# Patient Record
Sex: Female | Born: 1990 | Hispanic: No | Marital: Single | Smoking: Never smoker
Health system: Southern US, Community
[De-identification: ages and names within clinical notes are randomized; demographics above are authoritative.]

## PROBLEM LIST (undated history)

## (undated) DIAGNOSIS — F329 Major depressive disorder, single episode, unspecified: Secondary | ICD-10-CM

## (undated) DIAGNOSIS — F32A Depression, unspecified: Secondary | ICD-10-CM

---
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
## 2015-04-02 ENCOUNTER — Encounter (HOSPITAL_COMMUNITY): Payer: Self-pay | Admitting: *Deleted

## 2015-04-02 ENCOUNTER — Emergency Department (HOSPITAL_COMMUNITY)
Admission: EM | Admit: 2015-04-02 | Discharge: 2015-04-03 | Disposition: A | Discharge status: Home / Self Care | Payer: Self-pay | Attending: Emergency Medicine | Admitting: Emergency Medicine

## 2015-04-02 DIAGNOSIS — F32A Depression, unspecified: Secondary | ICD-10-CM

## 2015-04-02 DIAGNOSIS — Z3202 Encounter for pregnancy test, result negative: Secondary | ICD-10-CM | POA: Insufficient documentation

## 2015-04-02 DIAGNOSIS — F329 Major depressive disorder, single episode, unspecified: Secondary | ICD-10-CM | POA: Insufficient documentation

## 2015-04-02 DIAGNOSIS — G44209 Tension-type headache, unspecified, not intractable: Secondary | ICD-10-CM | POA: Insufficient documentation

## 2015-04-02 DIAGNOSIS — F39 Unspecified mood [affective] disorder: Secondary | ICD-10-CM | POA: Diagnosis present

## 2015-04-02 HISTORY — DX: Major depressive disorder, single episode, unspecified: F32.9

## 2015-04-02 HISTORY — DX: Depression, unspecified: F32.A

## 2015-04-02 LAB — COMPREHENSIVE METABOLIC PANEL
ALBUMIN: 4.9 g/dL (ref 3.5–5.0)
ALT: 13 U/L — ABNORMAL LOW (ref 14–54)
ANION GAP: 10 (ref 5–15)
AST: 17 U/L (ref 15–41)
Alkaline Phosphatase: 35 U/L — ABNORMAL LOW (ref 38–126)
BUN: 16 mg/dL (ref 6–20)
CALCIUM: 10.3 mg/dL (ref 8.9–10.3)
CO2: 24 mmol/L (ref 22–32)
CREATININE: 0.59 mg/dL (ref 0.44–1.00)
Chloride: 104 mmol/L (ref 101–111)
GFR calc non Af Amer: >60 mL/min (ref >60)
Glucose, Bld: 102 mg/dL — ABNORMAL HIGH (ref 65–99)
POTASSIUM: 3.6 mmol/L (ref 3.5–5.1)
SODIUM: 138 mmol/L (ref 135–145)
Total Bilirubin: 0.5 mg/dL (ref 0.3–1.2)
Total Protein: 9.2 g/dL — ABNORMAL HIGH (ref 6.5–8.1)

## 2015-04-02 LAB — RAPID URINE DRUG SCREEN, HOSP PERFORMED
Amphetamines: NOT DETECTED
Barbiturates: NOT DETECTED
Benzodiazepines: NOT DETECTED
Cocaine: NOT DETECTED
OPIATES: NOT DETECTED
TETRAHYDROCANNABINOL: NOT DETECTED

## 2015-04-02 LAB — SALICYLATE LEVEL

## 2015-04-02 LAB — CBC
HCT: 38.2 % (ref 36.0–46.0)
HEMOGLOBIN: 12.8 g/dL (ref 12.0–15.0)
MCH: 26.4 pg (ref 26.0–34.0)
MCHC: 33.5 g/dL (ref 30.0–36.0)
MCV: 78.8 fL (ref 78.0–100.0)
PLATELETS: 238 10*3/uL (ref 150–400)
RBC: 4.85 MIL/uL (ref 3.87–5.11)
RDW: 13.7 % (ref 11.5–15.5)
WBC: 5.4 10*3/uL (ref 4.0–10.5)

## 2015-04-02 LAB — ACETAMINOPHEN LEVEL: Acetaminophen (Tylenol), Serum: <10 ug/mL — ABNORMAL LOW (ref 10–30)

## 2015-04-02 LAB — I-STAT BETA HCG BLOOD, ED (MC, WL, AP ONLY): I-stat hCG, quantitative: <5 m[IU]/mL (ref <5)

## 2015-04-02 LAB — ETHANOL

## 2015-04-02 MED ORDER — IBUPROFEN 200 MG PO TABS: 600.0000 mg | ORAL_TABLET | Freq: Three times a day (TID) | ORAL | Status: DC | PRN

## 2015-04-02 MED ORDER — ZOLPIDEM TARTRATE 5 MG PO TABS: 5.0000 mg | ORAL_TABLET | Freq: Every evening | ORAL | Status: DC | PRN

## 2015-04-02 MED ORDER — LORAZEPAM 1 MG PO TABS: 1.0000 mg | ORAL_TABLET | Freq: Three times a day (TID) | ORAL | Status: DC | PRN

## 2015-04-02 MED ORDER — ALUM & MAG HYDROXIDE-SIMETH 200-200-20 MG/5ML PO SUSP: 30.0000 mL | ORAL | Status: DC | PRN

## 2015-04-02 MED ORDER — ONDANSETRON HCL 4 MG PO TABS: 4.0000 mg | ORAL_TABLET | Freq: Three times a day (TID) | ORAL | Status: DC | PRN

## 2015-04-02 MED ORDER — ACETAMINOPHEN 325 MG PO TABS: 650.0000 mg | ORAL_TABLET | ORAL | Status: DC | PRN

## 2015-04-02 NOTE — ED Notes (Signed)
Pt states that she began having a headache and generalized malaise since Fri; pt c/o nausea with no vomiting; pt states that she took Tylenol on Fri and it helped some but has taken anything since; pt denies photophobia or visual changes; pt states "I think it is stress"

## 2015-04-02 NOTE — ED Notes (Signed)
Pt belongings: beige pocket book, iphone, blue shoes, red and black pants, green shirt, jacket, necklace, bra

## 2015-04-02 NOTE — ED Notes (Signed)
[facility] ambulatory with a steady gait. Patient oriented to unit. Patient denies SI, HI and AVH at this time. Plan of care discussed with patient. Patient voices no complaints or concerns at this time. Encouragement and support provided and safety maintain.

## 2015-04-02 NOTE — ED Notes (Signed)
Pt states that she is feeling suicidal while doing triage; pt states that she feels this way when she gets stressed out; pt states that work and school and home life are too much for here to deal with; pt denies having a specific plan; pt states that she did have a plan but she doesn't anymore

## 2015-04-02 NOTE — ED Provider Notes (Signed)
CSN: [id number]     Arrival date & time 04/02/15  1944 History   First MD Initiated Contact with Patient 04/02/15 2231     Chief Complaint  Patient presents with  . Suicidal  . Headache     (Consider location/radiation/quality/duration/timing/severity/associated sxs/prior Treatment) Patient is a 24 y.o. female presenting with mental health disorder. The history is provided by the patient. No language interpreter was used.  Mental Health Problem Presenting symptoms: depression and suicidal thoughts   Degree of incapacity (severity):  Moderate Onset quality:  Gradual Timing:  Intermittent Progression:  Worsening Chronicity:  New Context: not alcohol use and not drug abuse   Treatment compliance:  Untreated Relieved by:  Nothing Worsened by:  Nothing tried Ineffective treatments:  None tried Associated symptoms: headaches   Risk factors: no family hx of mental illness   Pt complains of a headache today.  Pt reports she has depression.  No treatment.  Pt reports she can not talk about her emotions. Pt reports she does not have a plan to harm herself.  Pt reports if it happens it happens.   History reviewed. No pertinent past medical history. History reviewed. No pertinent past surgical history. No family history on file. Social History  Substance Use Topics  . Smoking status: Never Smoker   . Smokeless tobacco: None  . Alcohol Use: Yes     Comment: socially   OB History    No data available     Review of Systems  Neurological: Positive for headaches.  Psychiatric/Behavioral: Positive for suicidal ideas.  All other systems reviewed and are negative.     Allergies  Review of patient's allergies indicates no known allergies.  Home Medications   Prior to Admission medications   Medication Sig Start Date End Date Taking? Authorizing Provider  ibuprofen (ADVIL,MOTRIN) 200 MG tablet Take 400 mg by mouth every 6 (six) hours as needed for moderate pain.   Yes Historical  Provider, MD   BP 121/66 mmHg  Pulse 75  Temp(Src) 99.3 F (37.4 C) (Oral)  Resp 20  Ht 5\' 9"  (1.753 m)  Wt 52.617 kg  BMI 17.12 kg/m2  SpO2 100%  LMP 03/20/2015 Physical Exam  Constitutional: She is oriented to person, place, and time. She appears well-developed and well-nourished.  HENT:  Head: Normocephalic and atraumatic.  Right Ear: External ear normal.  Left Ear: External ear normal.  Mouth/Throat: Oropharynx is clear and moist.  Eyes: EOM are normal. Pupils are equal, round, and reactive to light.  Neck: Normal range of motion.  Cardiovascular: Normal rate and normal heart sounds.   Pulmonary/Chest: Effort normal.  Abdominal: She exhibits no distension.  Musculoskeletal: Normal range of motion.  Neurological: She is alert and oriented to person, place, and time.  Psychiatric:  Flat depressed appearring  Nursing note and vitals reviewed.   ED Course  Procedures (including critical care time) Labs Review Labs Reviewed  COMPREHENSIVE METABOLIC PANEL - Abnormal; Notable for the following:    Glucose, Bld 102 (*)    Total Protein 9.2 (*)    ALT 13 (*)    Alkaline Phosphatase 35 (*)    All other components within normal limits  ACETAMINOPHEN LEVEL - Abnormal; Notable for the following:    Acetaminophen (Tylenol), Serum <10 (*)    All other components within normal limits  ETHANOL  SALICYLATE LEVEL  CBC  URINE RAPID DRUG SCREEN, HOSP PERFORMED  I-STAT BETA HCG BLOOD, ED ([facility], [facility], [facility] ONLY)    Imaging Review  No results found. I have personally reviewed and evaluated these images and lab results as part of my medical decision-making.   EKG Interpretation None      MDM   Final diagnoses:  Depression  Tension-type headache, not intractable, unspecified chronicity pattern       Elson Areas, PA-C 04/02/15 2253  Tilden Fossa, MD 04/03/15 [phone]

## 2015-04-03 ENCOUNTER — Encounter (HOSPITAL_COMMUNITY): Payer: Self-pay | Admitting: *Deleted

## 2015-04-03 ENCOUNTER — Observation Stay (HOSPITAL_COMMUNITY)
Admission: AD | Admit: 2015-04-03 | Payer: Federal, State, Local not specified - Other | Source: Intra-hospital | Admitting: Psychiatry

## 2015-04-03 DIAGNOSIS — F329 Major depressive disorder, single episode, unspecified: Secondary | ICD-10-CM | POA: Insufficient documentation

## 2015-04-03 DIAGNOSIS — F32A Depression, unspecified: Secondary | ICD-10-CM | POA: Insufficient documentation

## 2015-04-03 DIAGNOSIS — F39 Unspecified mood [affective] disorder: Secondary | ICD-10-CM | POA: Insufficient documentation

## 2015-04-03 NOTE — BH Assessment (Signed)
Tele Assessment Note   Heather Mccullough is a 24 y.o. female who voluntarily presents to [facility] c/o headache and discomfort since Friday and told medical staff her illness may be related to stress.  While in the [employer], pt stated that she was SI.  Pt is no longer endorsing SI thoughts and she has no plan or intent to harm herself.  She admits that prior to her arrival in the [place], while still living in [place], she ingested oil that is used for lighting. This happened approx  7 yrs ago and she has not attempted to harm herself since that time.  She says was having thoughts prior to arrival and says she is only happy when she is around people and when she is alone she about not being here. Pt denies HI/SA/AVH.  She says she is stressed about work, "life" and school, stating she is sad because she was supposed to graduate, however it will not be this year, she is a Consulting civil engineer at [employer]  Pt contracted for safety with this Clinical research associate and requested to return home.  This Clinical research associate spoke with Donell Sievert, PA who recommends AM psych eval for final disposition.  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 Depression     History reviewed. No pertinent past surgical history.  Family History: No family history on file.  Social History:  reports that she has never smoked. She does not have any smokeless tobacco history on file. She reports that she drinks alcohol. She reports that she does not use illicit drugs.  Additional Social History:  Alcohol / Drug Use Pain Medications: See MAR  Prescriptions: See MAR  Over the Counter: See MAR  History of alcohol / drug use?: No history of alcohol / drug abuse Longest period of sobriety (when/how long): None   CIWA: CIWA-Ar BP: 111/65 mmHg Pulse Rate: 66 COWS:    PATIENT STRENGTHS: (choose at least two) Communication skills Supportive family/friends  Allergies: No  Known Allergies  Home Medications:  (Not in a hospital admission)  OB/GYN Status:  Patient's last menstrual period was 03/20/2015.  General Assessment Data [facility] of Assessment: [facility] TTS Assessment: In system Is this a Tele or Face-to-Face Assessment?: Tele Assessment Is this an Initial Assessment or a Re-assessment for this encounter?: Initial Assessment Marital status: Single Maiden name: None  Is patient pregnant?: No Pregnancy Status: No Living Arrangements: Parent, Other relatives (Lives with mother and brother ) Can pt return to current living arrangement?: Yes Admission Status: Voluntary Is patient capable of signing voluntary admission?: Yes Referral Source: MD Insurance type: SP  Medical Screening Exam [facility] Walk-in ONLY) Medical Exam completed: No Reason for MSE not completed: Other: (None )  Crisis Care Plan Living Arrangements: Parent, Other relatives (Lives with mother and brother ) Name of Psychiatrist: None  Name of Therapist: None   Education Status Is patient currently in school?: Yes Current Grade: Unk  Highest grade of school patient has completed: Unk  Name of school: [employer] Contact person: None   Risk to self with the past 6 months Suicidal Ideation: No-Not Currently/Within Last 6 Months Has patient been a risk to self within the past 6 months prior to admission? : No Suicidal Intent: No-Not Currently/Within Last 6 Months Has patient had any suicidal intent within the past 6 months prior to admission? : No Is patient at risk for suicide?: No Suicidal Plan?: No-Not  Currently/Within Last 6 Months Has patient had any suicidal plan within the past 6 months prior to admission? : Yes Access to Means: No What has been your use of drugs/alcohol within the last 12 months?: Pt denies  Previous Attempts/Gestures: Yes How many times?: 1 Other Self Harm Risks: None  Triggers for Past Attempts: Unpredictable Intentional Self Injurious Behavior: None Family  Suicide History: No Recent stressful life event(s): Other (Comment) (School, work-related and "life") Persecutory voices/beliefs?: No Depression: Yes Depression Symptoms: Loss of interest in usual pleasures Substance abuse history and/or treatment for substance abuse?: No Suicide prevention information given to non-admitted patients: Not applicable  Risk to Others within the past 6 months Homicidal Ideation: No Does patient have any lifetime risk of violence toward others beyond the six months prior to admission? : No Thoughts of Harm to Others: No Current Homicidal Intent: No Current Homicidal Plan: No Access to Homicidal Means: No Identified Victim: None  History of harm to others?: No Assessment of Violence: None Noted Violent Behavior Description: None  Does patient have access to weapons?: No Criminal Charges Pending?: No Does patient have a court date: No Is patient on probation?: No  Psychosis Hallucinations: None noted Delusions: None noted  Mental Status Report Appearance/Hygiene: In scrubs Eye Contact: Fair Motor Activity: Unremarkable Speech: Logical/coherent Level of Consciousness: Alert, Quiet/awake Mood: Depressed Affect: Sad Anxiety Level: None Thought Processes: Coherent, Relevant Judgement: Unimpaired Orientation: Person, Place, Time, Situation Obsessive Compulsive Thoughts/Behaviors: None  Cognitive Functioning Concentration: Normal Memory: Recent Intact, Remote Intact IQ: Average Insight: Fair Impulse Control: Fair Appetite: Fair Weight Loss: 0 Weight Gain: 0 Sleep: No Change Total Hours of Sleep: 6 Vegetative Symptoms: None  ADLScreening [facility] Assessment Services) Patient's cognitive ability adequate to safely complete daily activities?: Yes Patient able to express need for assistance with ADLs?: Yes Independently performs ADLs?: Yes (appropriate for developmental age)  Prior Inpatient Therapy Prior Inpatient Therapy: No Prior Therapy  Dates: None  Prior Therapy Facilty/Provider(s): None  Reason for Treatment: None   Prior Outpatient Therapy Prior Outpatient Therapy: No Prior Therapy Dates: None  Prior Therapy Facilty/Provider(s): None  Reason for Treatment: None  Does patient have an ACCT team?: No Does patient have Intensive In-House Services?  : No Does patient have [facility] services? : No Does patient have [facility] services?: No  ADL Screening (condition at time of admission) Patient's cognitive ability adequate to safely complete daily activities?: Yes Is the patient deaf or have difficulty hearing?: No Does the patient have difficulty seeing, even when wearing glasses/contacts?: No Does the patient have difficulty concentrating, remembering, or making decisions?: No Patient able to express need for assistance with ADLs?: Yes Does the patient have difficulty dressing or bathing?: No Independently performs ADLs?: Yes (appropriate for developmental age) Does the patient have difficulty walking or climbing stairs?: No Weakness of Legs: None Weakness of Arms/Hands: None  Home Assistive Devices/Equipment Home Assistive Devices/Equipment: None  Therapy Consults (therapy consults require a physician order) PT Evaluation Needed: No OT Evalulation Needed: No SLP Evaluation Needed: No Abuse/Neglect Assessment (Assessment to be complete while patient is alone) Physical Abuse: Denies Verbal Abuse: Denies Sexual Abuse: Denies Exploitation of patient/patient's resources: Denies Self-Neglect: Denies Values / Beliefs Cultural Requests During Hospitalization: None Spiritual Requests During Hospitalization: None Consults Spiritual Care Consult Needed: No Social Work Consult Needed: No Merchant navy officerAdvance Directives (For Healthcare) Does patient have an advance directive?: No Would patient like information on creating an advanced directive?: No - patient declined information    Additional Information 1:1  In Past 12 Months?:  No CIRT Risk: No Elopement Risk: No Does patient have medical clearance?: Yes     Disposition:  Disposition Initial Assessment Completed for this Encounter: Yes Disposition of Patient: Referred to (Per Donell Sievert, PA, AM psych eval for final disposition ) Patient referred to: Other (Comment) (Per Donell Sievert, PA, AM psych eval for final disposition )  Murrell Redden 04/03/2015 12:24 AM

## 2015-04-03 NOTE — ED Notes (Signed)
Discharge instructions given and reviewed.  All belongings were returned to pt.  Pt denied SI and HI.  Mental health resources given.

## 2015-04-03 NOTE — Progress Notes (Signed)
CM spoke with [place] who [place].  CM discussed and provided written information for uninsured accepting pcps, discussed the importance of pcp vs EDP services for f/u care, www.needymeds.org, www.goodrx.com, discounted pharmacies and other [place] resources such as [facility] , [facility], affordable care act, financial assistance, uninsured dental services, [place] med assist, DSS and  health department  Reviewed resources for [place] uninsured accepting pcps like [facility], [facility] [address], [facility], [facility], local urgent care centers, [facility], [facility], general medical clinics, [facility] services of the [place], [facility] urgent [facility] plus others, medication resources, [facility] out patient pharmacies and housing Pt voiced understanding and appreciation of resources provided   Provided [facility] contact information Pt agreed to a referral Cm completed referral Pt [facility] clinical [facility]ason Cm also emphasized to pt use of goodrx for discounted medications and [facility] for uninsured [facility] prior to d/c  All resources provided in [facility] translation for understanding

## 2015-04-03 NOTE — Consult Note (Signed)
[place] [facility] Consult   Reason for Consult:  Anger, agitation Referring Physician:  EDU Patient Identification: Heather Mccullough MRN:  [id number] Principal Diagnosis: Unspecified mood (affective) disorder ([place]) Diagnosis:   Patient Active Problem List   Diagnosis Date Noted  . Unspecified mood (affective) disorder ([place]) [F39] 04/03/2015    Priority: High    Total Time spent with patient: 45 minutes  Subjective:   Heather Mccullough is a 24 y.o. female patient admitted with Anger, agitaion.  HPI:  AA female, 24 years old was evaluated for suicide feeling.  Patient originally came to the ER for headache and fatigue but told staff in triage she was feeling suicidal.  Today, she denies feeling suicidal and stated that she does have thoughts of committing suicide once in a while.  She reported that the last time she had suicide thought was last week.  She states she think about suicide when she has a lot in her mind but could not elaborate further.    Patient reports that she is going through a lot of stress due to dropping out of school, work stress and family.  She lives with her mother and 3 brothers and states that her relationship with her mother is not the best.  Patient reports that she dropped out of school at [employer].  She reports poor sleep and appetite and denies any Alcohol or drug use.  Patient denies SI/HI/AVH. Collateral Information from Mother:  Patient's mother reported that patient is having difficulty with school and work  Her mother reported that she left [employer] this year and went up [place] for collage and came back after a semester.  Mother reports that in the house she is constantly angry and have no good relationship with her and her three brothers.  Mother denies any mental illness in the family, states that patient does not and have never been seen by a Management consultant.   Patient is discharged home to mother but  will follow up with [facility] for therapy sessions.  Past Psychiatric History:  Denies  Risk to Self: Suicidal Ideation: No-Not Currently/Within Last 6 Months Suicidal Intent: No-Not Currently/Within Last 6 Months Is patient at risk for suicide?: No Suicidal Plan?: No-Not Currently/Within Last 6 Months Access to Means: No What has been your use of drugs/alcohol within the last 12 months?: Pt denies  How many times?: 1 Other Self Harm Risks: None  Triggers for Past Attempts: Unpredictable Intentional Self Injurious Behavior: None Risk to Others: Homicidal Ideation: No Thoughts of Harm to Others: No Current Homicidal Intent: No Current Homicidal Plan: No Access to Homicidal Means: No Identified Victim: None  History of harm to others?: No Assessment of Violence: None Noted Violent Behavior Description: None  Does patient have access to weapons?: No Criminal Charges Pending?: No Does patient have a court date: No Prior Inpatient Therapy: Prior Inpatient Therapy: No Prior Therapy Dates: None  Prior Therapy Facilty/Provider(s): None  Reason for Treatment: None  Prior Outpatient Therapy: Prior Outpatient Therapy: No Prior Therapy Dates: None  Prior Therapy Facilty/Provider(s): None  Reason for Treatment: None  Does patient have an ACCT team?: No Does patient have Intensive In-House Services?  : No Does patient have [facility] services? : No Does patient have P4CC services?: No  Past Medical History:  Past Medical History  Diagnosis Date  . Depression    History reviewed. No pertinent past surgical history. Family History: No family history on file.   Family Psychiatric  History:  Denies by both mom and patient Social History:  History  Alcohol Use  . Yes    Comment: socially     History  Drug Use No    Social History   Social History  . Marital Status: Single    Spouse Name: N/A  . Number of Children: N/A  . Years of Education: N/A   Social History Main Topics  .  Smoking status: Never Smoker   . Smokeless tobacco: None  . Alcohol Use: Yes     Comment: socially  . Drug Use: No  . Sexual Activity: Not Asked   Other Topics Concern  . None   Social History Narrative   Additional Social History:    Pain Medications: See MAR  Prescriptions: See MAR  Over the Counter: See MAR  History of alcohol / drug use?: No history of alcohol / drug abuse Longest period of sobriety (when/how long): None    Allergies:  No Known Allergies  Labs:  Results for orders placed or performed during the hospital encounter of 04/02/15 (from the past 48 hour(s))  Comprehensive metabolic panel     Status: Abnormal   Collection Time: 04/02/15  8:39 PM  Result Value Ref Range   Sodium 138 135 - 145 mmol/L   Potassium 3.6 3.5 - 5.1 mmol/L   Chloride 104 101 - 111 mmol/L   CO2 24 22 - 32 mmol/L   Glucose, Bld 102 (H) 65 - 99 mg/dL   BUN 16 6 - 20 mg/dL   Creatinine, Ser 0.59 0.44 - 1.00 mg/dL   Calcium 10.3 8.9 - 10.3 mg/dL   Total Protein 9.2 (H) 6.5 - 8.1 g/dL   Albumin 4.9 3.5 - 5.0 g/dL   AST 17 15 - 41 U/L   ALT 13 (L) 14 - 54 U/L   Alkaline Phosphatase 35 (L) 38 - 126 U/L   Total Bilirubin 0.5 0.3 - 1.2 mg/dL   GFR calc non Af Amer >60 >60 mL/min   GFR calc Af Amer >60 >60 mL/min    Comment: (NOTE) The eGFR has been calculated using the CKD EPI equation. This calculation has not been validated in all clinical situations. eGFR's persistently <60 mL/min signify possible Chronic Kidney Disease.    Anion gap 10 5 - 15  Ethanol (ETOH)     Status: None   Collection Time: 04/02/15  8:39 PM  Result Value Ref Range   Alcohol, Ethyl (B) <5 <5 mg/dL    Comment:        LOWEST DETECTABLE LIMIT FOR SERUM ALCOHOL IS 5 mg/dL FOR MEDICAL PURPOSES ONLY   Salicylate level     Status: None   Collection Time: 04/02/15  8:39 PM  Result Value Ref Range   Salicylate Lvl <8.2 2.8 - 30.0 mg/dL  Acetaminophen level     Status: Abnormal   Collection Time: 04/02/15   8:39 PM  Result Value Ref Range   Acetaminophen (Tylenol), Serum <10 (L) 10 - 30 ug/mL    Comment:        THERAPEUTIC CONCENTRATIONS VARY SIGNIFICANTLY. A RANGE OF 10-30 ug/mL MAY BE AN EFFECTIVE CONCENTRATION FOR MANY PATIENTS. HOWEVER, SOME ARE BEST TREATED AT CONCENTRATIONS OUTSIDE THIS RANGE. ACETAMINOPHEN CONCENTRATIONS >150 ug/mL AT 4 HOURS AFTER INGESTION AND >50 ug/mL AT 12 HOURS AFTER INGESTION ARE OFTEN ASSOCIATED WITH TOXIC REACTIONS.   CBC     Status: None   Collection Time: 04/02/15  8:39 PM  Result Value Ref Range  WBC 5.4 4.0 - 10.5 K/uL   RBC 4.85 3.87 - 5.11 MIL/uL   Hemoglobin 12.8 12.0 - 15.0 g/dL   HCT 38.2 36.0 - 46.0 %   MCV 78.8 78.0 - 100.0 fL   MCH 26.4 26.0 - 34.0 pg   MCHC 33.5 30.0 - 36.0 g/dL   RDW 13.7 11.5 - 15.5 %   Platelets 238 150 - 400 K/uL  I-Stat beta hCG blood, ED ([facility], [facility], [facility] only)     Status: None   Collection Time: 04/02/15  8:45 PM  Result Value Ref Range   I-stat hCG, quantitative <5.0 <5 mIU/mL   Comment 3            Comment:   GEST. AGE      CONC.  (mIU/mL)   <=1 WEEK        5 - 50     2 WEEKS       50 - 500     3 WEEKS       100 - 10,000     4 WEEKS     1,000 - 30,000        FEMALE AND NON-PREGNANT FEMALE:     LESS THAN 5 mIU/mL   Urine rapid drug screen (hosp performed) (Not at [facility])     Status: None   Collection Time: 04/02/15 10:59 PM  Result Value Ref Range   Opiates NONE DETECTED NONE DETECTED   Cocaine NONE DETECTED NONE DETECTED   Benzodiazepines NONE DETECTED NONE DETECTED   Amphetamines NONE DETECTED NONE DETECTED   Tetrahydrocannabinol NONE DETECTED NONE DETECTED   Barbiturates NONE DETECTED NONE DETECTED    Comment:        DRUG SCREEN FOR MEDICAL PURPOSES ONLY.  IF CONFIRMATION IS NEEDED FOR ANY PURPOSE, NOTIFY LAB WITHIN 5 DAYS.        LOWEST DETECTABLE LIMITS FOR URINE DRUG SCREEN Drug Class       Cutoff (ng/mL) Amphetamine      1000 Barbiturate      200 Benzodiazepine   102 Tricyclics        725 Opiates          300 Cocaine          300 THC              50     Current Facility-Administered Medications  Medication Dose Route Frequency Provider Last Rate Last Dose  . acetaminophen (TYLENOL) tablet 650 mg  650 mg Oral Q4H PRN Fransico Meadow, PA-C      . alum & mag hydroxide-simeth (MAALOX/MYLANTA) 200-200-20 MG/5ML suspension 30 mL  30 mL Oral PRN Fransico Meadow, PA-C      . ibuprofen (ADVIL,MOTRIN) tablet 600 mg  600 mg Oral Q8H PRN Fransico Meadow, PA-C      . LORazepam (ATIVAN) tablet 1 mg  1 mg Oral Q8H PRN Fransico Meadow, PA-C      . ondansetron [facility]) tablet 4 mg  4 mg Oral Q8H PRN Fransico Meadow, PA-C      . zolpidem (AMBIEN) tablet 5 mg  5 mg Oral QHS PRN Fransico Meadow, PA-C       Current Outpatient Prescriptions  Medication Sig Dispense Refill  . ibuprofen (ADVIL,MOTRIN) 200 MG tablet Take 400 mg by mouth every 6 (six) hours as needed for moderate pain.      Musculoskeletal: Strength & Muscle Tone: within normal limits Gait & Station: normal Patient leans: N/A  Psychiatric  Specialty Exam: Review of Systems  Constitutional: Negative.   HENT: Negative.   Eyes: Negative.   Respiratory: Negative.   Cardiovascular: Negative.   Gastrointestinal: Negative.   Genitourinary: Negative.   Skin: Negative.   Neurological: Negative.   Endo/Heme/Allergies: Negative.     Blood pressure 96/60, pulse 71, temperature 98 F (36.7 C), temperature source Oral, resp. rate 14, height 5' 9"  (1.753 m), weight 52.617 kg (116 lb), last menstrual period 03/20/2015, SpO2 100 %.Body mass index is 17.12 kg/(m^2).  General Appearance: Casual and Fairly Groomed  Eye Contact::  Minimal  Speech:  Clear and Coherent and Normal Rate  Volume:  Normal  Mood:  Angry  Affect:  Congruent  Thought Process:  Coherent, Goal Directed and Intact  Orientation:  Full (Time, Place, and Person)  Thought Content:  WDL  Suicidal Thoughts:  No  Homicidal Thoughts:  No  Memory:  Immediate;    Good Recent;   Good Remote;   Good  Judgement:  Fair  Insight:  Fair  Psychomotor Activity:  Normal  Concentration:  Fair  Recall:  NA  Fund of Knowledge:Fair  Language: Good  Akathisia:  NA  Handed:  Right  AIMS (if indicated):     Assets:  Desire for Improvement  ADL's:  Intact  Cognition: WNL  Sleep:      Disposition: Discharge home, follow up with Gearldine Bienenstock   PMHNP-BC 04/03/2015 11:10 AM Patient seen face-to-face for psychiatric evaluation, chart reviewed and case discussed with the physician extender and developed treatment plan. Reviewed the information documented and agree with the treatment plan. Corena Pilgrim, MD

## 2015-04-03 NOTE — BH Assessment (Signed)
04/03/2015  [facility] by Dr. Jannifer FranklinAkintayo and Julieanne CottonJosephine, NP. The attending provider for the obs unit is Patria ManeMay Agustin, NP and Dr. Lucianne MussKumar. The chair assignment is #6. Nursing report [phone]. [facility].   Writer attempted to get patient to sign support paperwork but she refused. Patient sts, "I don't think anything is wrong with me I would rather go home". Writer updated the psych providers, Dr. Jannifer FranklinAkintayo and Jospehine, NP. The providers agree to discharge patient with [facility].

## 2017-01-29 ENCOUNTER — Emergency Department (HOSPITAL_COMMUNITY)
Admission: EM | Admit: 2017-01-29 | Discharge: 2017-01-29 | Disposition: A | Discharge status: Home / Self Care | Payer: Self-pay | Attending: Emergency Medicine | Admitting: Emergency Medicine

## 2017-01-29 ENCOUNTER — Encounter (HOSPITAL_COMMUNITY): Payer: Self-pay | Admitting: Emergency Medicine

## 2017-01-29 DIAGNOSIS — Z203 Contact with and (suspected) exposure to rabies: Secondary | ICD-10-CM | POA: Insufficient documentation

## 2017-01-29 DIAGNOSIS — Y929 Unspecified place or not applicable: Secondary | ICD-10-CM | POA: Insufficient documentation

## 2017-01-29 DIAGNOSIS — S71152A Open bite, left thigh, initial encounter: Secondary | ICD-10-CM | POA: Insufficient documentation

## 2017-01-29 DIAGNOSIS — Z23 Encounter for immunization: Secondary | ICD-10-CM | POA: Insufficient documentation

## 2017-01-29 DIAGNOSIS — Y998 Other external cause status: Secondary | ICD-10-CM | POA: Insufficient documentation

## 2017-01-29 DIAGNOSIS — W540XXA Bitten by dog, initial encounter: Secondary | ICD-10-CM | POA: Insufficient documentation

## 2017-01-29 DIAGNOSIS — Y939 Activity, unspecified: Secondary | ICD-10-CM | POA: Insufficient documentation

## 2017-01-29 LAB — I-STAT BETA HCG BLOOD, ED (MC, WL, AP ONLY)

## 2017-01-29 MED ORDER — RABIES VACCINE, PCEC IM SUSR
1.0000 mL | Freq: Once | INTRAMUSCULAR | Status: AC
  Administered 2017-01-29: 1 mL via INTRAMUSCULAR
  Filled 2017-01-29: qty 1

## 2017-01-29 MED ORDER — RABIES IMMUNE GLOBULIN 150 UNIT/ML IM INJ
1200.0000 [IU] | INJECTION | Freq: Once | INTRAMUSCULAR | Status: AC
  Administered 2017-01-29: 1200 [IU] via INTRAMUSCULAR
  Filled 2017-01-29: qty 8

## 2017-01-29 MED ORDER — AMOXICILLIN-POT CLAVULANATE 875-125 MG PO TABS: 1.0000 | ORAL_TABLET | Freq: Two times a day (BID) | ORAL | 0 refills | Status: DC

## 2017-01-29 MED ORDER — AMOXICILLIN-POT CLAVULANATE 875-125 MG PO TABS
1.0000 | ORAL_TABLET | Freq: Once | ORAL | Status: AC
  Administered 2017-01-29: 1 via ORAL
  Filled 2017-01-29: qty 1

## 2017-01-29 MED ORDER — TETANUS-DIPHTH-ACELL PERTUSSIS 5-2.5-18.5 LF-MCG/0.5 IM SUSP
0.5000 mL | Freq: Once | INTRAMUSCULAR | Status: AC
  Administered 2017-01-29: 0.5 mL via INTRAMUSCULAR
  Filled 2017-01-29: qty 0.5

## 2017-01-29 NOTE — ED Provider Notes (Signed)
[facility]-EMERGENCY DEPT Provider Note   CSN: [id number] Arrival date & time: 01/29/17  0001     History   Chief Complaint Chief Complaint  Patient presents with  . Animal Bite    HPI Heather Mccullough is a 26 y.o. female Presents to the ED for evaluation of dog bite to left lateral upper thigh onset less than 8 hours PTA. Patient states the dog belonged to a towing company. Unsure of vaccination status or health of the dog. Her tetanus shot is not up to date. Reports mild tenderness locally around bite wound.   HPI  Past Medical History:  Diagnosis Date  . Depression     Patient Active Problem List   Diagnosis Date Noted  . Unspecified mood (affective) disorder ([facility]) 04/03/2015  . Depression   . Mood disorder ([facility])     History reviewed. No pertinent surgical history.  OB History    No data available       Home Medications    Prior to Admission medications   Medication Sig Start Date End Date Taking? Authorizing Provider  amoxicillin-clavulanate (AUGMENTIN) 875-125 MG tablet Take 1 tablet by mouth every 12 (twelve) hours. 01/29/17   Liberty Handy, PA-C  ibuprofen (ADVIL,MOTRIN) 200 MG tablet Take 400 mg by mouth every 6 (six) hours as needed for moderate pain.    [provider]    Family History No family history on file.  Social History Social History  Substance Use Topics  . Smoking status: Never Smoker  . Smokeless tobacco: Not on file  . Alcohol use Yes     Comment: socially     Allergies   Patient has no known allergies.   Review of Systems Review of Systems  Skin: Positive for wound.  Allergic/Immunologic: Negative for immunocompromised state.  All other systems reviewed and are negative.    Physical Exam Updated Vital Signs BP 106/62 (BP Location: Right Arm)   Pulse 76   Temp 98.4 F (36.9 C) (Oral)   Resp 16   Ht  (1.753 m)   Wt 54.4 kg (120 lb)   SpO2 100%   BMI 17.72 kg/m   Physical Exam  Constitutional:  She is oriented to person, place, and time. She appears well-developed and well-nourished. No distress.  NAD.  HENT:  Head: Normocephalic and atraumatic.  Right Ear: External ear normal.  Left Ear: External ear normal.  Nose: Nose normal.  Eyes: Conjunctivae and EOM are normal. No scleral icterus.  Neck: Normal range of motion. Neck supple.  Cardiovascular: Normal rate, regular rhythm and normal heart sounds.   No murmur heard. Pulmonary/Chest: Effort normal and breath sounds normal. She has no wheezes.  Musculoskeletal: Normal range of motion. She exhibits no deformity.  Neurological: She is alert and oriented to person, place, and time.  Skin: Skin is warm and dry. Capillary refill takes less than 2 seconds.  Obvious circular bite mark to left lateral thigh, appropriate local tenderness. No bleeding.   Psychiatric: She has a normal mood and affect. Her behavior is normal. Judgment and thought content normal.  Nursing note and vitals reviewed.    ED Treatments / Results  Labs (all labs ordered are listed, but only abnormal results are displayed) Labs Reviewed  I-STAT BETA HCG BLOOD, ED ([facility], [facility], [facility] ONLY)    EKG  EKG Interpretation None       Radiology No results found.  Procedures Procedures (including critical care time)  Medications Ordered in ED Medications  rabies immune globulin (HYPERAB/KEDRAB) injection 1,200 Units (1,200 Units Intramuscular Given 01/29/17 0630)  rabies vaccine (RABAVERT) injection 1 mL (1 mL Intramuscular Given 01/29/17 0606)  Tdap (BOOSTRIX) injection 0.5 mL (0.5 mLs Intramuscular Given 01/29/17 0526)  amoxicillin-clavulanate (AUGMENTIN) 875-125 MG per tablet 1 tablet (1 tablet Oral Given 01/29/17 0612)     Initial Impression / Assessment and Plan / ED Course  I have reviewed the triage vital signs and the nursing notes.  Pertinent labs & imaging results that were available during my care of the patient were reviewed by me and considered in  my medical decision making (see chart for details).    26 year old female presents to the ED with dog bite to left lateral upper thigh onset less than 8 hours PTA. She does not know the vaccination status or health status of the dog. Her tetanus is not up-to-date. On exam, she has obvious bite mark with appropriate local tenderness. No bleeding. Surrounding compartments are soft. Wound was thoroughly irrigated and cleaned in the ED. Tdap given. Rabies immunoglobulin and vaccine given today, day 0. We'll discharged with wound care instructions, Augmentin and schedule for repeat rabies vaccinations. She is to follow up in urgent care, emergency department or PCP on day 3, 7 and 14 for rabies vaccine. ED return precautions discussed.  Day 0 (01/29/17) - Rabies immunoglobulin and vaccines given Day 3 (02/01/17) - Rabies vaccine needed Day 7 (02/05/17) - Rabies vaccine needed Day 14 (02/12/17) - Rabies vaccine needed  Final Clinical Impressions(s) / ED Diagnoses   Final diagnoses:  Dog bite, initial encounter  Need for rabies vaccination    New Prescriptions New Prescriptions   AMOXICILLIN-CLAVULANATE (AUGMENTIN) 875-125 MG TABLET    Take 1 tablet by mouth every 12 (twelve) hours.     Liberty Handy, PA-C 01/29/17 1610    Zadie Rhine, MD 01/29/17 [phone]

## 2017-01-29 NOTE — ED Triage Notes (Signed)
Pt states her car was towed and when she went to towing company to pick her car up a dog there attacked her. Pt has bite mark to L hand and L thigh.   They are unsure if dog has up to date shots. Her tetanus shot is not up to date.

## 2017-01-29 NOTE — Discharge Instructions (Signed)
You presented to the ED after dog bite. Your wound was thoroughly cleaned. You received a tetanus shot. You also received rabies immunoglobulin and vaccine today.  You need a repeat rabies vaccine on days 3, 7 and 14. Follow up with primary care doctor, urgent care emergency department to receive the rabies vaccines. Here is your rabies schedule: Day 0 (01/29/17) - Rabies immunoglobulin and vaccines done Day 3 (02/01/17) - Rabies vaccine Day 7 (02/05/17) - Rabies vaccine Day 14 (02/12/17) - Rabies vaccine  Keep your wound clean, rinse with water and soap at least twice daily. Monitor for signs of infection including increased pain, swelling, redness, drainage, fevers. Take antibiotic as prescribed.

## 2017-01-29 NOTE — ED Notes (Signed)
Irrigated/cleaned wounds on left hand and left leg with NS and sterile gauze.

## 2017-01-29 NOTE — ED Notes (Addendum)
Attempted blood draw x1 in left AC without success.  Called phlebotomy to draw lab.

## 2017-02-01 ENCOUNTER — Encounter (HOSPITAL_COMMUNITY): Payer: Self-pay | Admitting: Emergency Medicine

## 2017-02-01 ENCOUNTER — Emergency Department (HOSPITAL_COMMUNITY)
Admission: EM | Admit: 2017-02-01 | Discharge: 2017-02-01 | Disposition: A | Discharge status: Home / Self Care | Payer: Self-pay | Attending: Emergency Medicine | Admitting: Emergency Medicine

## 2017-02-01 DIAGNOSIS — Z23 Encounter for immunization: Secondary | ICD-10-CM | POA: Insufficient documentation

## 2017-02-01 DIAGNOSIS — Z203 Contact with and (suspected) exposure to rabies: Secondary | ICD-10-CM | POA: Insufficient documentation

## 2017-02-01 MED ORDER — RABIES VACCINE, PCEC IM SUSR
1.0000 mL | Freq: Once | INTRAMUSCULAR | Status: AC
Start: 2017-02-01 — End: 2017-02-01
  Administered 2017-02-01: 1 mL via INTRAMUSCULAR
  Filled 2017-02-01: qty 1

## 2017-02-01 NOTE — ED Triage Notes (Signed)
Follow up for rabies series

## 2017-02-01 NOTE — ED Provider Notes (Signed)
  [facility]-EMERGENCY DEPT Provider Note   CSN: [id number] Arrival date & time: 02/01/17  1043     History   Chief Complaint Chief Complaint  Patient presents with  . Animal Bite    rabies series    HPI Heather Mccullough is a 26 y.o. female presents to the ED for second rabies vaccine. Patient reports no problems.  HPI  Past Medical History:  Diagnosis Date  . Depression     Patient Active Problem List   Diagnosis Date Noted  . Unspecified mood (affective) disorder ([facility]) 04/03/2015  . Depression   . Mood disorder ([facility])     History reviewed. No pertinent surgical history.  OB History    No data available       Home Medications    Prior to Admission medications   Medication Sig Start Date End Date Taking? Authorizing Provider  amoxicillin-clavulanate (AUGMENTIN) 875-125 MG tablet Take 1 tablet by mouth every 12 (twelve) hours. 01/29/17   Liberty Handy, PA-C  ibuprofen (ADVIL,MOTRIN) 200 MG tablet Take 400 mg by mouth every 6 (six) hours as needed for moderate pain.    [provider]    Family History History reviewed. No pertinent family history.  Social History Social History  Substance Use Topics  . Smoking status: Never Smoker  . Smokeless tobacco: Not on file  . Alcohol use Yes     Comment: socially     Allergies   Patient has no known allergies.   Review of Systems Review of Systems  Musculoskeletal: Positive for arthralgias.  Skin: Positive for wound.  All other systems reviewed and are negative.    Physical Exam Updated Vital Signs BP 106/65 (BP Location: Right Arm)   Pulse 69   Temp 98.7 F (37.1 C) (Oral)   Wt 54.4 kg (120 lb)   LMP 01/25/2017 (Approximate)   SpO2 100%   BMI 17.72 kg/m   Physical Exam  Constitutional: She is oriented to person, place, and time. She appears well-developed and well-nourished. No distress.  HENT:  Head: Normocephalic.  Eyes: EOM are normal.  Neck: Neck supple.  Cardiovascular:  Normal rate.   Pulmonary/Chest: Effort normal.  Musculoskeletal: Normal range of motion.  Neurological: She is alert and oriented to person, place, and time. No cranial nerve deficit.  Skin:  Healing wounds to the lateral aspect of the left thigh. No drainage or signs of infection.   Psychiatric: She has a normal mood and affect.  Nursing note and vitals reviewed.    ED Treatments / Results  Labs (all labs ordered are listed, but only abnormal results are displayed) Labs Reviewed - No data to display   Radiology No results found.  Procedures Procedures (including critical care time)  Medications Ordered in ED Medications  rabies vaccine (RABAVERT) injection 1 mL (not administered)     Initial Impression / Assessment and Plan / ED Course  I have reviewed the triage vital signs and the nursing notes. 26 y.o. female here for second rabies vaccine stable for d/c without signs of wound infection or other problems.   Final Clinical Impressions(s) / ED Diagnoses   Final diagnoses:  Encounter for repeat administration of rabies vaccination    New Prescriptions New Prescriptions   No medications on file     Janne Napoleon, NP 02/01/17 1229    Doug Sou, MD 02/01/17 [phone]

## 2017-02-01 NOTE — Discharge Instructions (Signed)
Follow up as planned for your next vaccine.

## 2017-02-05 ENCOUNTER — Encounter (HOSPITAL_COMMUNITY): Payer: Self-pay | Admitting: *Deleted

## 2017-02-05 ENCOUNTER — Ambulatory Visit (HOSPITAL_COMMUNITY)
Admission: EM | Admit: 2017-02-05 | Discharge: 2017-02-05 | Disposition: A | Discharge status: Home / Self Care | Payer: Self-pay | Attending: Family Medicine | Admitting: Family Medicine

## 2017-02-05 DIAGNOSIS — Z203 Contact with and (suspected) exposure to rabies: Secondary | ICD-10-CM

## 2017-02-05 DIAGNOSIS — Z23 Encounter for immunization: Secondary | ICD-10-CM

## 2017-02-05 MED ORDER — RABIES VACCINE, PCEC IM SUSR
1.0000 mL | Freq: Once | INTRAMUSCULAR | Status: AC
  Administered 2017-02-05: 1 mL via INTRAMUSCULAR

## 2017-02-05 MED ORDER — RABIES VACCINE, PCEC IM SUSR
INTRAMUSCULAR | Status: AC
  Filled 2017-02-05: qty 1

## 2017-02-05 NOTE — ED Triage Notes (Signed)
Here for  The  Next  In  Series  Of  Rabies  Injections

## 2017-02-05 NOTE — Discharge Instructions (Signed)
Return  As  Directed  For  The  Next  In  Series  Of  Injections

## 2017-02-12 ENCOUNTER — Ambulatory Visit (HOSPITAL_COMMUNITY)
Admission: EM | Admit: 2017-02-12 | Discharge: 2017-02-12 | Disposition: A | Discharge status: Home / Self Care | Payer: Self-pay | Attending: Internal Medicine | Admitting: Internal Medicine

## 2017-02-12 ENCOUNTER — Encounter (HOSPITAL_COMMUNITY): Payer: Self-pay | Admitting: Emergency Medicine

## 2017-02-12 DIAGNOSIS — Z23 Encounter for immunization: Secondary | ICD-10-CM

## 2017-02-12 DIAGNOSIS — Z203 Contact with and (suspected) exposure to rabies: Secondary | ICD-10-CM

## 2017-02-12 MED ORDER — RABIES VACCINE, PCEC IM SUSR
1.0000 mL | Freq: Once | INTRAMUSCULAR | Status: AC
  Administered 2017-02-12: 1 mL via INTRAMUSCULAR

## 2017-02-12 MED ORDER — RABIES VACCINE, PCEC IM SUSR
INTRAMUSCULAR | Status: AC
  Filled 2017-02-12: qty 1

## 2017-02-12 NOTE — ED Triage Notes (Signed)
Pt here for last rabies vaccination

## 2017-11-16 ENCOUNTER — Other Ambulatory Visit: Payer: Self-pay

## 2017-11-16 ENCOUNTER — Emergency Department (HOSPITAL_COMMUNITY): Payer: Self-pay

## 2017-11-16 ENCOUNTER — Encounter (HOSPITAL_COMMUNITY): Payer: Self-pay | Admitting: Emergency Medicine

## 2017-11-16 ENCOUNTER — Emergency Department (HOSPITAL_COMMUNITY)
Admission: EM | Admit: 2017-11-16 | Discharge: 2017-11-16 | Disposition: A | Discharge status: Home / Self Care | Payer: Self-pay | Attending: Emergency Medicine | Admitting: Emergency Medicine

## 2017-11-16 DIAGNOSIS — A599 Trichomoniasis, unspecified: Secondary | ICD-10-CM

## 2017-11-16 DIAGNOSIS — Z79899 Other long term (current) drug therapy: Secondary | ICD-10-CM | POA: Insufficient documentation

## 2017-11-16 DIAGNOSIS — N390 Urinary tract infection, site not specified: Secondary | ICD-10-CM

## 2017-11-16 LAB — WET PREP, GENITAL
Clue Cells Wet Prep HPF POC: NONE SEEN
Sperm: NONE SEEN
Yeast Wet Prep HPF POC: NONE SEEN

## 2017-11-16 LAB — URINALYSIS, ROUTINE W REFLEX MICROSCOPIC
Bilirubin Urine: NEGATIVE
Glucose, UA: NEGATIVE mg/dL
Hgb urine dipstick: NEGATIVE
Ketones, ur: NEGATIVE mg/dL
Nitrite: NEGATIVE
PROTEIN: NEGATIVE mg/dL
Specific Gravity, Urine: 1.019 (ref 1.005–1.030)
pH: 5 (ref 5.0–8.0)

## 2017-11-16 LAB — COMPREHENSIVE METABOLIC PANEL
ALT: 13 U/L (ref 0–44)
AST: 19 U/L (ref 15–41)
Albumin: 4.2 g/dL (ref 3.5–5.0)
Alkaline Phosphatase: 37 U/L — ABNORMAL LOW (ref 38–126)
Anion gap: 14 (ref 5–15)
BUN: 11 mg/dL (ref 6–20)
CHLORIDE: 105 mmol/L (ref 98–111)
CO2: 19 mmol/L — AB (ref 22–32)
Calcium: 9.8 mg/dL (ref 8.9–10.3)
Creatinine, Ser: 0.65 mg/dL (ref 0.44–1.00)
GFR calc non Af Amer: >60 mL/min (ref >60)
Glucose, Bld: 84 mg/dL (ref 70–99)
POTASSIUM: 4 mmol/L (ref 3.5–5.1)
SODIUM: 138 mmol/L (ref 135–145)
Total Bilirubin: 0.4 mg/dL (ref 0.3–1.2)
Total Protein: 8.5 g/dL — ABNORMAL HIGH (ref 6.5–8.1)

## 2017-11-16 LAB — CBC
HEMATOCRIT: 38.4 % (ref 36.0–46.0)
HEMOGLOBIN: 11.8 g/dL — AB (ref 12.0–15.0)
MCH: 24.9 pg — AB (ref 26.0–34.0)
MCHC: 30.7 g/dL (ref 30.0–36.0)
MCV: 81 fL (ref 78.0–100.0)
PLATELETS: 216 10*3/uL (ref 150–400)
RBC: 4.74 MIL/uL (ref 3.87–5.11)
RDW: 13.7 % (ref 11.5–15.5)
WBC: 4.5 10*3/uL (ref 4.0–10.5)

## 2017-11-16 LAB — LIPASE, BLOOD: LIPASE: 35 U/L (ref 11–51)

## 2017-11-16 LAB — I-STAT BETA HCG BLOOD, ED (MC, WL, AP ONLY)

## 2017-11-16 MED ORDER — AZITHROMYCIN 250 MG PO TABS
1000.0000 mg | ORAL_TABLET | Freq: Once | ORAL | Status: AC
Start: 2017-11-16 — End: 2017-11-16
  Administered 2017-11-16: 1000 mg via ORAL
  Filled 2017-11-16: qty 4

## 2017-11-16 MED ORDER — LIDOCAINE HCL (PF) 1 % IJ SOLN
INTRAMUSCULAR | Status: AC
  Administered 2017-11-16: 0.9 mL
  Filled 2017-11-16: qty 5

## 2017-11-16 MED ORDER — CEFTRIAXONE SODIUM 250 MG IJ SOLR
250.0000 mg | Freq: Once | INTRAMUSCULAR | Status: AC
  Administered 2017-11-16: 250 mg via INTRAMUSCULAR
  Filled 2017-11-16: qty 250

## 2017-11-16 MED ORDER — CEPHALEXIN 500 MG PO CAPS: 500.0000 mg | ORAL_CAPSULE | Freq: Two times a day (BID) | ORAL | 0 refills | Status: DC

## 2017-11-16 MED ORDER — METRONIDAZOLE 500 MG PO TABS: 500.0000 mg | ORAL_TABLET | Freq: Two times a day (BID) | ORAL | 0 refills | Status: DC

## 2017-11-16 MED ORDER — IOHEXOL 300 MG/ML  SOLN
100.0000 mL | Freq: Once | INTRAMUSCULAR | Status: AC | PRN
  Administered 2017-11-16: 100 mL via INTRAVENOUS

## 2017-11-16 NOTE — ED Triage Notes (Signed)
Pt. Stated, I have stomach pain started 20 min ago.

## 2017-11-16 NOTE — ED Provider Notes (Signed)
Patient placed in Quick Look pathway, seen and evaluated   Chief Complaint: Abdominal pain  HPI:   Patient presents complaining of right lower quadrant abdominal pain which began suddenly approximately 20 minutes prior to arrival.  She states that she was sitting at work.  She states that she has been experiencing similar pains intermittently for over 9 years.  She says today the pain is much more severe which brought her to the ED for evaluation.  Sometimes accompanied by nausea and vomiting but not today.  Denies diarrhea or urinary symptoms.  No fevers or chills.  ROS: Positive for abdominal pain Negative for fevers, chills, urinary frequency, urgency, dysuria, or hematuria  Physical Exam:   Gen: No distress, appears somewhat uncomfortable  Neuro: Awake and Alert  Skin: Warm    Focused Exam: Decreased bowel sounds globally.  Tender to palpation in the right lower quadrant, Murphy sign absent, Rovsing's absent, there is tenderness to palpation at McBurney's point.  Psoas sign present.  Obturator sign absent.  No CVA tenderness.   Initiation of care has begun. The patient has been counseled on the process, plan, and necessity for staying for the completion/evaluation, and the remainder of the medical screening examination    Bennye AlmFawze, Jamarkis Branam A, PA-C 11/16/17 1322    Azalia Bilisampos, Kevin, MD 11/17/17 [phone]

## 2017-11-16 NOTE — ED Provider Notes (Signed)
[facility] EMERGENCY DEPARTMENT Provider Note    CSN: [id number] Arrival date & time: 11/16/17  1307   History   Chief Complaint Chief Complaint  Patient presents with  . Abdominal Pain    started 20 min ago    HPI Heather Mccullough is a 27 y.o. female.  HPI   [name] presents today with abdominal pain. Patient noted she had abdominal pain that started shortly prior to arrival to emergency room. She describes this as suprapubic, she denies any urinary complaints, bowel changes. She does note a history of approximately 3-4 months of vaginal discharge that has not improved with [place] infection treatments. Patient reports she is sexually active with one partner, but has had several recently. Patient denies any upper abdominal pain fever chills nausea or vomiting.   Past Medical History:  Diagnosis Date  . Depression     Patient Active Problem List   Diagnosis Date Noted  . Unspecified mood (affective) disorder (HCC) 04/03/2015  . Depression   . Mood disorder (HCC)     History reviewed. No pertinent surgical history.   OB History   None      Home Medications    Prior to Admission medications   Medication Sig Start Date End Date Taking? Authorizing Provider  ibuprofen (ADVIL,MOTRIN) 200 MG tablet Take 400 mg by mouth every 6 (six) hours as needed for moderate pain.   Yes [provider]  amoxicillin-clavulanate (AUGMENTIN) 875-125 MG tablet Take 1 tablet by mouth every 12 (twelve) hours. Patient not taking: Reported on 11/16/2017 01/29/17   Liberty Handy, PA-C  cephALEXin (KEFLEX) 500 MG capsule Take 1 capsule (500 mg total) by mouth 2 (two) times daily. 11/16/17   Rudie Sermons, Tinnie Gens, PA-C  metroNIDAZOLE (FLAGYL) 500 MG tablet Take 1 tablet (500 mg total) by mouth 2 (two) times daily. 11/16/17   Eyvonne Mechanic, PA-C    Family History No family history on file.  Social History Social History   Tobacco Use  . Smoking status: Never Smoker    . Smokeless tobacco: Never Used  Substance Use Topics  . Alcohol use: Yes    Comment: socially  . Drug use: No     Allergies   Patient has no known allergies.   Review of Systems Review of Systems  All other systems reviewed and are negative.    Physical Exam Updated Vital Signs BP 103/62 (BP Location: Right Arm)   Pulse 64   Temp 98.1 F (36.7 C) (Oral)   Resp 16   Ht 5\' 9"  (1.753 m)   LMP 11/09/2017   SpO2 100%   BMI 17.72 kg/m   Physical Exam  Constitutional: She is oriented to person, place, and time. She appears well-developed and well-nourished.  HENT:  Head: Normocephalic and atraumatic.  Eyes: Pupils are equal, round, and reactive to light. Conjunctivae are normal. Right eye exhibits no discharge. Left eye exhibits no discharge. No scleral icterus.  Neck: Normal range of motion. No JVD present. No tracheal deviation present.  Pulmonary/Chest: Effort normal. No stridor.  Abdominal:  Minor suprapubic tenderness palpation, remainder abdomen soft nontender  Genitourinary:  Genitourinary Comments: Sticky yellow vaginal discharge, no cervical motion tenderness and adnexal tenderness or masses  Neurological: She is alert and oriented to person, place, and time. Coordination normal.  Psychiatric: She has a normal mood and affect. Her behavior is normal. Judgment and thought content normal.  Nursing note and vitals reviewed.    ED Treatments / Results  Labs (all labs ordered are listed, but only abnormal results are displayed) Labs Reviewed  WET PREP, GENITAL - Abnormal; Notable for the following components:      Result Value   Trich, Wet Prep PRESENT (*)    WBC, Wet Prep HPF POC MANY (*)    All other components within normal limits  COMPREHENSIVE METABOLIC PANEL - Abnormal; Notable for the following components:   CO2 19 (*)    Total Protein 8.5 (*)    Alkaline Phosphatase 37 (*)    All other components within normal limits  CBC - Abnormal; Notable for  the following components:   Hemoglobin 11.8 (*)    MCH 24.9 (*)    All other components within normal limits  URINALYSIS, ROUTINE W REFLEX MICROSCOPIC - Abnormal; Notable for the following components:   APPearance CLOUDY (*)    Leukocytes, UA LARGE (*)    Bacteria, UA FEW (*)    All other components within normal limits  LIPASE, BLOOD  I-STAT BETA HCG BLOOD, ED ([facility], [facility], [facility] ONLY)  GC/CHLAMYDIA PROBE AMP ([place]) NOT AT [facility]    EKG None  Radiology Ct Abdomen Pelvis W Contrast  Result Date: 11/16/2017 CLINICAL DATA:  27 y/o  F; right lower quadrant abdominal pain. EXAM: CT ABDOMEN AND PELVIS WITH CONTRAST TECHNIQUE: Multidetector CT imaging of the abdomen and pelvis was performed using the standard protocol following bolus administration of intravenous contrast. CONTRAST:  100 cc Omnipaque 300 COMPARISON:  None. FINDINGS: Lower chest: No acute abnormality. Hepatobiliary: No focal liver abnormality is seen. No gallstones, gallbladder wall thickening, or biliary dilatation. Pancreas: Unremarkable. No pancreatic ductal dilatation or surrounding inflammatory changes. Spleen: Normal in size without focal abnormality. Adrenals/Urinary Tract: Adrenal glands are unremarkable. Kidneys are normal, without renal calculi, focal lesion, or hydronephrosis. Bladder is unremarkable. Stomach/Bowel: Stomach is within normal limits. Appendix appears normal. No evidence of bowel wall thickening, distention, or inflammatory changes. Vascular/Lymphatic: No significant vascular findings are present. No enlarged abdominal or pelvic lymph nodes. Reproductive: Uterus and bilateral adnexa are unremarkable. Other: No abdominal wall hernia or abnormality. No abdominopelvic ascites. Musculoskeletal: No acute or significant osseous findings. IMPRESSION: No acute process identified. Unremarkable CT of the abdomen and pelvis. Electronically Signed   By: Mitzi Hansen M.D.   On: 11/16/2017 16:08     Procedures Procedures (including critical care time)  Medications Ordered in ED Medications  cefTRIAXone (ROCEPHIN) injection 250 mg (has no administration in time range)  azithromycin (ZITHROMAX) tablet 1,000 mg (has no administration in time range)  iohexol (OMNIPAQUE) 300 MG/ML solution 100 mL (100 mLs Intravenous Contrast Given 11/16/17 1527)     Initial Impression / Assessment and Plan / ED Course  I have reviewed the triage vital signs and the nursing notes.  Pertinent labs & imaging results that were available during my care of the patient were reviewed by me and considered in my medical decision making (see chart for details).     Labs: wet prep, urinalysis, I-STAT beta hCG, lipase, CBC  Imaging:CT abdomen pelvis with contrast  Consults:  Therapeutics:ceftriaxone, azithromycin  Discharge Meds: metronidazole, Keflex  Assessment/Plan: 27 year old female presents today with complaints of abdominal pain. Patient has had 3-4 months of vaginal discharge likely secondary to trichomoniasis. She had acute abdominal pain in the suprapubic region today. Patient's urine concerning for urinary tract infection and will be treated. Patient will be treated for trichomoniasis denies cyst, also treated for gonorrhea and chlamydia. CT with no acute findings.Patient discharged with outpatient  follow-up obstructive cautions.patient realized understanding and agreement to today's plan. Encouraged her to have all sexual partners tested and treated.    Final Clinical Impressions(s) / ED Diagnoses   Final diagnoses:  Trichimoniasis  Urinary tract infection without hematuria, site unspecified    ED Discharge Orders        Ordered    metroNIDAZOLE (FLAGYL) 500 MG tablet  2 times daily     11/16/17 1736    cephALEXin (KEFLEX) 500 MG capsule  2 times daily     11/16/17 1737       Shatika Grinnell, Tinnie GensJeffrey, PA-C 11/16/17 1737    Vanetta MuldersZackowski, Scott, MD 11/18/17 [phone]

## 2017-11-16 NOTE — ED Notes (Signed)
Patient transported to CT 

## 2017-11-16 NOTE — Discharge Instructions (Addendum)
Please read attached information. If you experience any new or worsening signs or symptoms please return to the emergency room for evaluation. Please follow-up with your primary care provider or specialist as discussed. Please use medication prescribed only as directed and discontinue taking if you have any concerning signs or symptoms.   °

## 2017-11-17 LAB — GC/CHLAMYDIA PROBE AMP (~~LOC~~) NOT AT ARMC
Chlamydia: POSITIVE — AB
Neisseria Gonorrhea: NEGATIVE

## 2018-08-17 ENCOUNTER — Emergency Department (HOSPITAL_COMMUNITY)
Admission: EM | Admit: 2018-08-17 | Discharge: 2018-08-17 | Disposition: A | Discharge status: Home / Self Care | Payer: Self-pay | Attending: Emergency Medicine | Admitting: Emergency Medicine

## 2018-08-17 ENCOUNTER — Other Ambulatory Visit: Payer: Self-pay

## 2018-08-17 DIAGNOSIS — B9789 Other viral agents as the cause of diseases classified elsewhere: Secondary | ICD-10-CM

## 2018-08-17 DIAGNOSIS — J069 Acute upper respiratory infection, unspecified: Secondary | ICD-10-CM | POA: Insufficient documentation

## 2018-08-17 DIAGNOSIS — Z79899 Other long term (current) drug therapy: Secondary | ICD-10-CM | POA: Insufficient documentation

## 2018-08-17 DIAGNOSIS — J988 Other specified respiratory disorders: Secondary | ICD-10-CM

## 2018-08-17 NOTE — ED Triage Notes (Signed)
3 day hx of fe ver, HA, sore throat, runny nose.   Seen at [facility] on Saturday with neg test results reported. Today presents with continued c/o HA, weakness.   Denies SOB or cough.

## 2018-08-17 NOTE — Discharge Instructions (Addendum)
Please read attached information. If you experience any new or worsening signs or symptoms please return to the emergency room for evaluation. Please follow-up with your primary care provider or specialist as discussed.  °

## 2018-08-17 NOTE — ED Notes (Signed)
Tooik 2 ibu this AM without pain relief.

## 2018-08-17 NOTE — ED Provider Notes (Signed)
[facility] Provider [facility]   CSN: [id number] Arrival date & time: 08/17/18  [id number]    History   Chief Complaint No chief complaint on file.   HPI Heather Mccullough is a 28 y.o. female.     HPI   28 year old female presents today with complaints of upper respiratory infection.  Heather notes symptoms started approximately 3 days ago with sore throat, generalized headache and nonproductive cough.  She was seen at urgent care had negative strep and a negative influenza swab.  She notes she persisted to have fatigue nonproductive cough and sore throat.  She denies any difficulty swallowing, denies any shortness of breath.  Reports taking ibuprofen prior to arrival.  She denies any fever at home but notes that she has felt hot.  She denies any history of asthma COPD or any significant respiratory or comorbidities.  She notes close sick contacts with similar symptoms.  No recent exposure to known COVID-19 patients.  She notes that she is not pregnant, does not smoke.  Past Medical History:  Diagnosis Date  . Depression     Heather Active Problem List   Diagnosis Date Noted  . Unspecified mood (affective) disorder (HCC) 04/03/2015  . Depression   . Mood disorder (HCC)     No past surgical history on file.   OB History   No obstetric history on file.      Home Medications    Prior to Admission medications   Medication Sig Start Date End Date Taking? Authorizing Provider  amoxicillin-clavulanate (AUGMENTIN) 875-125 MG tablet Take 1 tablet by mouth every 12 (twelve) hours. Heather not taking: Reported on 11/16/2017 01/29/17   Liberty HandyGibbons, Claudia J, PA-C  cephALEXin (KEFLEX) 500 MG capsule Take 1 capsule (500 mg total) by mouth 2 (two) times daily. 11/16/17   Ricci Dirocco, Tinnie GensJeffrey, PA-C  ibuprofen (ADVIL,MOTRIN) 200 MG tablet Take 400 mg by mouth every 6 (six) hours as needed for moderate pain.    [provider]  metroNIDAZOLE (FLAGYL) 500 MG  tablet Take 1 tablet (500 mg total) by mouth 2 (two) times daily. 11/16/17   Eyvonne MechanicHedges, [place] Thielman, PA-C    Family History No family history on file.  Social History Social History   Tobacco Use  . Smoking status: Never Smoker  . Smokeless tobacco: Never Used  Substance Use Topics  . Alcohol use: Yes    Comment: socially  . Drug use: No     Allergies   Heather has no known allergies.   Review of Systems Review of Systems  All other systems reviewed and are negative.    Physical Exam Updated Vital Signs BP 101/72 (BP Location: Right Arm)   Pulse 78   Temp 98.5 F (36.9 C) (Oral)   Resp 14   Ht 5\' 9"  (1.753 m)   Wt 68 kg   SpO2 99%   BMI 22.15 kg/m   Physical Exam Vitals signs and nursing note reviewed.  Constitutional:      Appearance: She is well-developed.  HENT:     Head: Normocephalic and atraumatic.     Comments: Oropharynx clear no swelling erythema or edema, neck supple full active range of motion Eyes:     General: No scleral icterus.       Right eye: No discharge.        Left eye: No discharge.     Conjunctiva/sclera: Conjunctivae normal.     Pupils: Pupils are equal, round, and reactive to light.  Neck:  Musculoskeletal: Normal range of motion.     Vascular: No JVD.     Trachea: No tracheal deviation.  Pulmonary:     Effort: Pulmonary effort is normal. No respiratory distress.     Breath sounds: No stridor. No wheezing, rhonchi or rales.  Neurological:     Mental Status: She is alert and oriented to person, place, and time.     Coordination: Coordination normal.  Psychiatric:        Behavior: Behavior normal.        Thought Content: Thought content normal.        Judgment: Judgment normal.      ED Treatments / Results  Labs (all labs ordered are listed, but only abnormal results are displayed) Labs Reviewed - No data to display  EKG None  Radiology No results found.  Procedures Procedures (including critical care time)   Medications Ordered in ED Medications - No data to display   Initial Impression / Assessment and Plan / ED Course  I have reviewed the triage vital signs and the nursing notes.  Pertinent labs & imaging results that were available during my care of the Heather were reviewed by me and considered in my medical decision making (see chart for details).         Assessment/Plan: 28 year old female presents today with likely viral URI.  Coronavirus in the differential given negative strep and influenza.  Heather does not meet criteria for testing, she has no signs of significant lower respiratory infection that requires ongoing evaluation and management.  She is discharged with symptomatic care and strict return precautions.  She verbalized understanding and agreement to today's plan.     Final Clinical Impressions(s) / ED Diagnoses   Final diagnoses:  Viral respiratory infection    ED Discharge Orders    None       Rosalio Loud 08/17/18 1026    Cathren Laine, MD 08/17/18 1127

## 2018-08-24 ENCOUNTER — Other Ambulatory Visit: Payer: Self-pay

## 2018-08-24 ENCOUNTER — Emergency Department (HOSPITAL_COMMUNITY)
Admission: EM | Admit: 2018-08-24 | Discharge: 2018-08-24 | Disposition: A | Discharge status: Home / Self Care | Payer: Self-pay | Attending: Emergency Medicine | Admitting: Emergency Medicine

## 2018-08-24 ENCOUNTER — Encounter (HOSPITAL_COMMUNITY): Payer: Self-pay | Admitting: Emergency Medicine

## 2018-08-24 DIAGNOSIS — F329 Major depressive disorder, single episode, unspecified: Secondary | ICD-10-CM | POA: Insufficient documentation

## 2018-08-24 DIAGNOSIS — R05 Cough: Secondary | ICD-10-CM | POA: Insufficient documentation

## 2018-08-24 DIAGNOSIS — R059 Cough, unspecified: Secondary | ICD-10-CM

## 2018-08-24 MED ORDER — BENZONATATE 100 MG PO CAPS: 100.0000 mg | ORAL_CAPSULE | Freq: Three times a day (TID) | ORAL | 0 refills | Status: DC

## 2018-08-24 NOTE — Discharge Instructions (Addendum)
Please use cough medication as needed.  If you have any new or worsening signs or symptoms please return to the emergency room.

## 2018-08-24 NOTE — ED Provider Notes (Signed)
[facility] Provider Note   CSN: [id number] Arrival date & time: 08/24/18  1216    History   Chief Complaint Chief Complaint  Patient presents with  . Letter for School/Work    HPI Kathyann Ronnald CollumBofola Bolamba is a 28 y.o. female.     HPI   28 year old female presents today for recheck of respiratory infection.  She was initially seen on 08/17/2018 with viral infection.  She was given symptomatic care instructions and discharged.  She notes that it has been approximately 1 week and noted that she was was to go to back to work today based on the previous work note.  She reports that since leaving she has no longer had a fever, has improvement in her cough, does not feel unwell.  She denies any shortness of breath or any other concerns here today.  Past Medical History:  Diagnosis Date  . Depression     Patient Active Problem List   Diagnosis Date Noted  . Unspecified mood (affective) disorder (HCC) 04/03/2015  . Depression   . Mood disorder (HCC)     No past surgical history on file.   OB History   No obstetric history on file.      Home Medications    Prior to Admission medications   Medication Sig Start Date End Date Taking? Authorizing Provider  amoxicillin-clavulanate (AUGMENTIN) 875-125 MG tablet Take 1 tablet by mouth every 12 (twelve) hours. Patient not taking: Reported on 11/16/2017 01/29/17   Liberty HandyGibbons, Claudia J, PA-C  benzonatate (TESSALON) 100 MG capsule Take 1 capsule (100 mg total) by mouth every 8 (eight) hours. 08/24/18   Jamieon Lannen, Tinnie GensJeffrey, PA-C  cephALEXin (KEFLEX) 500 MG capsule Take 1 capsule (500 mg total) by mouth 2 (two) times daily. 11/16/17   Byrl Latin, Tinnie GensJeffrey, PA-C  ibuprofen (ADVIL,MOTRIN) 200 MG tablet Take 400 mg by mouth every 6 (six) hours as needed for moderate pain.    [provider]  metroNIDAZOLE (FLAGYL) 500 MG tablet Take 1 tablet (500 mg total) by mouth 2 (two) times daily. 11/16/17   Eyvonne MechanicHedges, Deiondra Denley,  PA-C    Family History No family history on file.  Social History Social History   Tobacco Use  . Smoking status: Never Smoker  . Smokeless tobacco: Never Used  Substance Use Topics  . Alcohol use: Yes    Comment: socially  . Drug use: No     Allergies   Patient has no known allergies.   Review of Systems Review of Systems  All other systems reviewed and are negative.   Physical Exam Updated Vital Signs BP 104/75 (BP Location: Right Arm)   Pulse 88   Temp 98.5 F (36.9 C) (Oral)   Resp 18   SpO2 100%   Physical Exam Vitals signs and nursing note reviewed.  Constitutional:      Appearance: She is well-developed.  HENT:     Head: Normocephalic and atraumatic.  Eyes:     General: No scleral icterus.       Right eye: No discharge.        Left eye: No discharge.     Conjunctiva/sclera: Conjunctivae normal.     Pupils: Pupils are equal, round, and reactive to light.  Neck:     Musculoskeletal: Normal range of motion.     Vascular: No JVD.     Trachea: No tracheal deviation.  Pulmonary:     Effort: Pulmonary effort is normal. No respiratory distress.     Breath  sounds: Normal breath sounds. No stridor. No wheezing, rhonchi or rales.  Neurological:     Mental Status: She is alert and oriented to person, place, and time.     Coordination: Coordination normal.  Psychiatric:        Behavior: Behavior normal.        Thought Content: Thought content normal.        Judgment: Judgment normal.      ED Treatments / Results  Labs (all labs ordered are listed, but only abnormal results are displayed) Labs Reviewed - No data to display  EKG None  Radiology No results found.  Procedures Procedures (including critical care time)  Medications Ordered in ED Medications - No data to display   Initial Impression / Assessment and Plan / ED Course  I have reviewed the triage vital signs and the nursing notes.  Pertinent labs & imaging results that were  available during my care of the patient were reviewed by me and considered in my medical decision making (see chart for details).         Assessment/Plan: 28 year old female presents today for recheck.  She has improvement in symptoms has not been febrile.  I informed her that her original work note was sufficient and she may return to work based on her reported symptoms.  Patient is not pregnant or breast-feeding.   Final Clinical Impressions(s) / ED Diagnoses   Final diagnoses:  Cough    ED Discharge Orders         Ordered    benzonatate (TESSALON) 100 MG capsule  Every 8 hours     08/24/18 1230           Eyvonne Mechanic, PA-C 08/24/18 1231    Gerhard Munch, MD 08/24/18 [phone]

## 2018-08-24 NOTE — ED Triage Notes (Signed)
Pt here for work note. She was home quarantined for covid symptoms and feels better. Pt says she was home for 7 days. Pt wants to know if she can return to work

## 2018-08-27 ENCOUNTER — Emergency Department (HOSPITAL_COMMUNITY)
Admission: EM | Admit: 2018-08-27 | Discharge: 2018-08-27 | Disposition: A | Discharge status: Home / Self Care | Payer: Self-pay | Attending: Emergency Medicine | Admitting: Emergency Medicine

## 2018-08-27 ENCOUNTER — Emergency Department (HOSPITAL_COMMUNITY): Payer: Self-pay

## 2018-08-27 ENCOUNTER — Other Ambulatory Visit: Payer: Self-pay

## 2018-08-27 ENCOUNTER — Encounter (HOSPITAL_COMMUNITY): Payer: Self-pay

## 2018-08-27 DIAGNOSIS — R05 Cough: Secondary | ICD-10-CM | POA: Insufficient documentation

## 2018-08-27 DIAGNOSIS — R0982 Postnasal drip: Secondary | ICD-10-CM | POA: Insufficient documentation

## 2018-08-27 DIAGNOSIS — R059 Cough, unspecified: Secondary | ICD-10-CM

## 2018-08-27 DIAGNOSIS — H6982 Other specified disorders of Eustachian tube, left ear: Secondary | ICD-10-CM | POA: Insufficient documentation

## 2018-08-27 DIAGNOSIS — R42 Dizziness and giddiness: Secondary | ICD-10-CM | POA: Insufficient documentation

## 2018-08-27 LAB — CBC WITH DIFFERENTIAL/PLATELET
Abs Immature Granulocytes: 0 10*3/uL (ref 0.00–0.07)
Basophils Absolute: 0 10*3/uL (ref 0.0–0.1)
Basophils Relative: 1 %
Eosinophils Absolute: 0.1 10*3/uL (ref 0.0–0.5)
Eosinophils Relative: 2 %
HCT: 41 % (ref 36.0–46.0)
Hemoglobin: 12.6 g/dL (ref 12.0–15.0)
Immature Granulocytes: 0 %
Lymphocytes Relative: 35 %
Lymphs Abs: 0.8 10*3/uL (ref 0.7–4.0)
MCH: 24.9 pg — ABNORMAL LOW (ref 26.0–34.0)
MCHC: 30.7 g/dL (ref 30.0–36.0)
MCV: 81 fL (ref 80.0–100.0)
Monocytes Absolute: 0.3 10*3/uL (ref 0.1–1.0)
Monocytes Relative: 13 %
Neutro Abs: 1.1 10*3/uL — ABNORMAL LOW (ref 1.7–7.7)
Neutrophils Relative %: 49 %
Platelets: 220 10*3/uL (ref 150–400)
RBC: 5.06 MIL/uL (ref 3.87–5.11)
RDW: 13 % (ref 11.5–15.5)
WBC: 2.2 10*3/uL — ABNORMAL LOW (ref 4.0–10.5)
nRBC: 0 % (ref 0.0–0.2)

## 2018-08-27 LAB — BASIC METABOLIC PANEL
Anion gap: 11 (ref 5–15)
BUN: 9 mg/dL (ref 6–20)
CO2: 22 mmol/L (ref 22–32)
Calcium: 9.9 mg/dL (ref 8.9–10.3)
Chloride: 105 mmol/L (ref 98–111)
Creatinine, Ser: 0.63 mg/dL (ref 0.44–1.00)
GFR calc Af Amer: >60 mL/min (ref >60)
GFR calc non Af Amer: >60 mL/min (ref >60)
Glucose, Bld: 92 mg/dL (ref 70–99)
Potassium: 3.7 mmol/L (ref 3.5–5.1)
Sodium: 138 mmol/L (ref 135–145)

## 2018-08-27 LAB — URINALYSIS, ROUTINE W REFLEX MICROSCOPIC
Bilirubin Urine: NEGATIVE
Glucose, UA: NEGATIVE mg/dL
Ketones, ur: NEGATIVE mg/dL
Leukocytes,Ua: NEGATIVE
Nitrite: NEGATIVE
Protein, ur: 30 mg/dL — AB
RBC / HPF: >50 RBC/hpf — ABNORMAL HIGH (ref 0–5)
Specific Gravity, Urine: 1.02 (ref 1.005–1.030)
pH: 6 (ref 5.0–8.0)

## 2018-08-27 LAB — PREGNANCY, URINE: Preg Test, Ur: NEGATIVE

## 2018-08-27 NOTE — ED Provider Notes (Signed)
[facility] EMERGENCY DEPARTMENT Provider Note   CSN: [id number] Arrival date & time: 08/27/18  1316    History   Chief Complaint Chief Complaint  Patient presents with  . Follow-up    HPI Heather Mccullough is a 28 y.o. female.     Patient is a 28 year old female with past medical history of depression who presents the emergency department for dizziness and work follow-up.  This is patient's third visit to the emergency department this month.  Initially she was seen on 7 April for URI symptoms which were mild.  She was given a work note for 7 days.  After the 7th day she came back to be cleared for work.  At that time she is feeling much better.  She reports that last night while she was at work she began to feel somewhat dizzy and had an episode of tightness in her chest.  This lasted for about 10 minutes and she reports that her employer was concerned that she had COVID-19 and should be checked out.  She tells me that currently she feels fine but that she is still been having some postnasal drip causing a cough.  Reports occasional feeling of squeezing and tightness in her chest.  Reports that the dizziness was enough to make her want to stop working.  The fever, chills, nausea, vomiting, diarrhea, abdominal pain, dysuria, hematuria.     Past Medical History:  Diagnosis Date  . Depression     Patient Active Problem List   Diagnosis Date Noted  . Unspecified mood (affective) disorder (HCC) 04/03/2015  . Depression   . Mood disorder (HCC)     History reviewed. No pertinent surgical history.   OB History   No obstetric history on file.      Home Medications    Prior to Admission medications   Medication Sig Start Date End Date Taking? Authorizing Provider  amoxicillin-clavulanate (AUGMENTIN) 875-125 MG tablet Take 1 tablet by mouth every 12 (twelve) hours. Patient not taking: Reported on 11/16/2017 01/29/17   Liberty Handy, PA-C  benzonatate  (TESSALON) 100 MG capsule Take 1 capsule (100 mg total) by mouth every 8 (eight) hours. 08/24/18   Hedges, Tinnie Gens, PA-C  cephALEXin (KEFLEX) 500 MG capsule Take 1 capsule (500 mg total) by mouth 2 (two) times daily. 11/16/17   Hedges, Tinnie Gens, PA-C  ibuprofen (ADVIL,MOTRIN) 200 MG tablet Take 400 mg by mouth every 6 (six) hours as needed for moderate pain.    [provider]  metroNIDAZOLE (FLAGYL) 500 MG tablet Take 1 tablet (500 mg total) by mouth 2 (two) times daily. 11/16/17   Eyvonne Mechanic, PA-C    Family History No family history on file.  Social History Social History   Tobacco Use  . Smoking status: Never Smoker  . Smokeless tobacco: Never Used  Substance Use Topics  . Alcohol use: Yes    Comment: socially  . Drug use: No     Allergies   Patient has no known allergies.   Review of Systems Review of Systems  Constitutional: Negative for chills and fever.  HENT: Positive for postnasal drip and sore throat. Negative for congestion, ear pain, rhinorrhea and sinus pressure.   Eyes: Negative for pain and visual disturbance.  Respiratory: Positive for cough. Negative for shortness of breath.   Cardiovascular: Negative for chest pain and palpitations.  Gastrointestinal: Negative for abdominal pain, diarrhea, nausea and vomiting.  Genitourinary: Negative for dysuria and hematuria.  Musculoskeletal: Negative for arthralgias  and back pain.  Skin: Negative for color change and rash.  Neurological: Positive for dizziness and light-headedness. Negative for tremors, seizures, syncope, facial asymmetry, speech difficulty, weakness, numbness and headaches.  All other systems reviewed and are negative.    Physical Exam Updated Vital Signs BP 101/62 (BP Location: Right Arm)   Pulse 92   Temp 98 F (36.7 C) (Oral)   Resp 16   Ht 5\' 9"  (1.753 m)   Wt 68 kg   SpO2 100%   BMI 22.15 kg/m   Physical Exam Vitals signs and nursing note reviewed.  Constitutional:       General: She is not in acute distress.    Appearance: She is well-developed.  HENT:     Head: Normocephalic and atraumatic.     Right Ear: Ear canal and external ear normal. There is no impacted cerumen.     Left Ear: Ear canal and external ear normal. There is no impacted cerumen.     Ears:     Comments: Fluid behind the left tympanic membrane.    Nose: Nose normal. No congestion or rhinorrhea.     Mouth/Throat:     Mouth: Mucous membranes are moist.  Eyes:     Conjunctiva/sclera: Conjunctivae normal.  Neck:     Musculoskeletal: Neck supple.  Cardiovascular:     Rate and Rhythm: Normal rate and regular rhythm.     Heart sounds: No murmur.  Pulmonary:     Effort: Pulmonary effort is normal. No respiratory distress.     Breath sounds: Normal breath sounds.  Abdominal:     Palpations: Abdomen is soft.     Tenderness: There is no abdominal tenderness.  Skin:    General: Skin is warm and dry.     Capillary Refill: Capillary refill takes less than 2 seconds.  Neurological:     General: No focal deficit present.     Mental Status: She is alert.     Cranial Nerves: No cranial nerve deficit.  Psychiatric:        Mood and Affect: Mood normal.      ED Treatments / Results  Labs (all labs ordered are listed, but only abnormal results are displayed) Labs Reviewed  URINALYSIS, ROUTINE W REFLEX MICROSCOPIC - Abnormal; Notable for the following components:      Result Value   APPearance HAZY (*)    Hgb urine dipstick LARGE (*)    Protein, ur 30 (*)    RBC / HPF >50 (*)    Bacteria, UA RARE (*)    All other components within normal limits  PREGNANCY, URINE  CBC WITH DIFFERENTIAL/PLATELET  BASIC METABOLIC PANEL    EKG None  Radiology Dg Chest 1 View  Result Date: 08/27/2018 CLINICAL DATA:  Chest pain and dizziness EXAM: CHEST  1 VIEW COMPARISON:  May 31, 2009 FINDINGS: The lungs are clear. Heart size and pulmonary vascularity are normal. No adenopathy. No pneumothorax.  No bone lesions. IMPRESSION: No abnormality noted. Electronically Signed   By: Bretta BangWilliam  Woodruff III M.D.   On: 08/27/2018 13:55    Procedures Procedures (including critical care time)  Medications Ordered in ED Medications - No data to display   Initial Impression / Assessment and Plan / ED Course  I have reviewed the triage vital signs and the nursing notes.  Pertinent labs & imaging results that were available during my care of the patient were reviewed by me and considered in my medical decision making (see chart for details).  Clinical Course as of Aug 26 1448  Fri Aug 27, 2018  1339 I believe that the symptoms are related still to an upper respiratory infection.  This is about day 14 of her symptoms and therefore she should not be contagious anymore.  She is not having any fever.  She does have fluid behind her left ear which may be causing her dizziness as well.  She also may have an aspect of seasonal allergies causing the symptoms as well.  However, given the fact that this is her third visit here in the symptoms have been ongoing for about 2 weeks, I am going to do a work-up with some labs, chest x-ray, EKG, urinalysis.   [KM]    Clinical Course User Index [KM] Arlyn Dunning, PA-C         Final Clinical Impressions(s) / ED Diagnoses   Final diagnoses:  Dysfunction of left eustachian tube  Cough    ED Discharge Orders    None       Arlyn Dunning, PA-C 08/27/18 1458    Melene Plan, DO 08/27/18 1535    Melene Plan, DO 08/27/18 1550

## 2018-08-27 NOTE — Discharge Instructions (Addendum)
You can try taking an over the counter allergy medication to help with your symptoms. Thank you for allowing me to care for you today. Please return to the emergency department if you have new or worsening symptoms. Take your medications as instructed.

## 2018-08-27 NOTE — ED Triage Notes (Signed)
Pt presents because "sometimes it feels like there is something in my throat", and endorses dizziness x3 days ago at work. Pt states she was sent home by her manager, states she wants to know if she can be cleared to return to work.

## 2018-08-27 NOTE — ED Provider Notes (Signed)
Care assumed from [facility], [place].  Please see her full H&P.  In short,  Heather Mccullough is a 28 y.o. female with a PMH of depression presents for upper respiratory symptoms onset 2 weeks ago. Patient has had a cough, sore throat, postnasal drip, dizziness, and intermittent lightheadedness. Labs are pending.   UA reveals hbg, RBCs, and rare bacteria. Patient denies any urinary symptoms. BMP is unremarkable. CBC reveals leukopenia at 2.2. Will advise patient to follow up with PCP. Patient is stable and will be discharged to home. Discussed return precautions. Patient states she understands and agrees with plan.    Leretha Dykes, [place] 08/27/18 1548    Melene Plan, DO 08/27/18 1851

## 2018-08-29 LAB — URINE CULTURE: Culture: NO GROWTH

## 2019-08-14 IMAGING — CT CT ABD-PELV W/ CM
2 of 4 series · 15 of 46 positions shown, 17 images · IV contrast (Omni 300)
Comparison: None.

CLINICAL DATA: 27 y/o  F; right lower quadrant abdominal pain.

EXAM:
CT ABDOMEN AND PELVIS WITH CONTRAST
TECHNIQUE: Multidetector CT imaging of the abdomen and pelvis was performed
using the standard protocol following bolus administration of
intravenous contrast.
CONTRAST:  100 cc Omnipaque 300

[Series 3: a/p w/ 5mm · axial · 0.61mm/px · z∈[+810,+1195]mm · 12 of 93 slices shown, 14 images]
[im 8/93  soft-tissue]
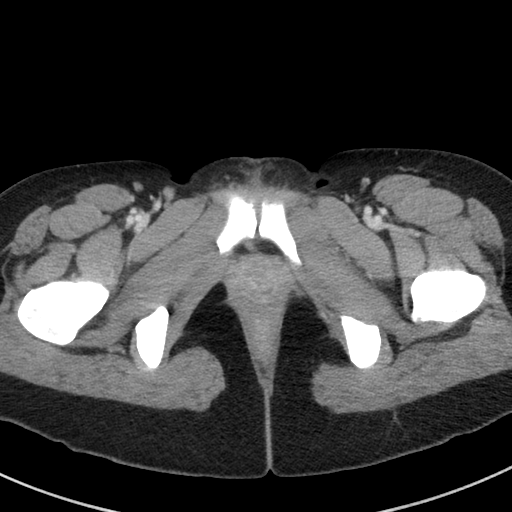
[im 8/93  bone]
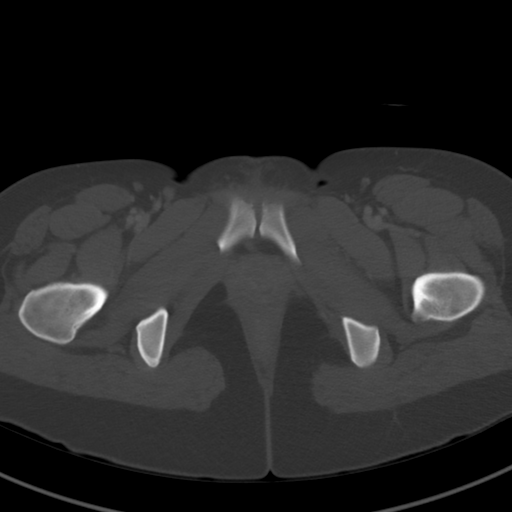
[im 15/93  soft-tissue]
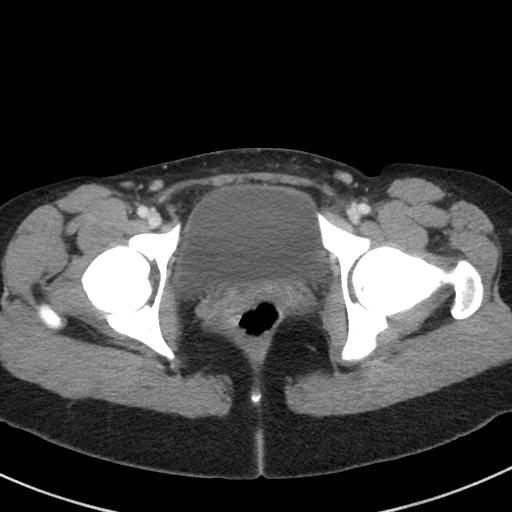
[im 22/93  soft-tissue]
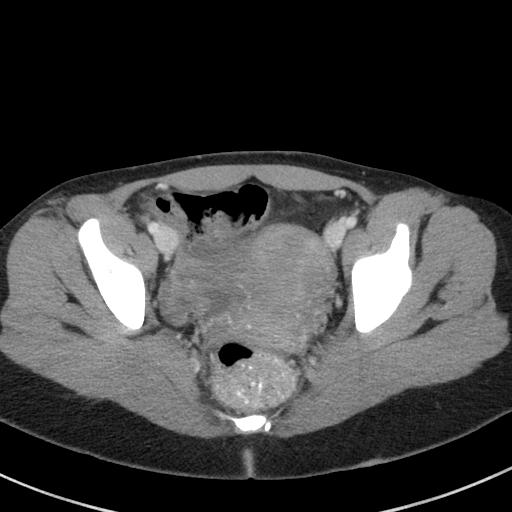
[im 29/93  soft-tissue]
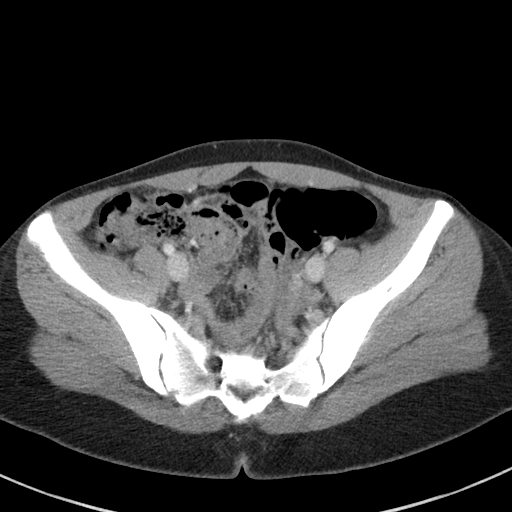
[im 36/93  soft-tissue]
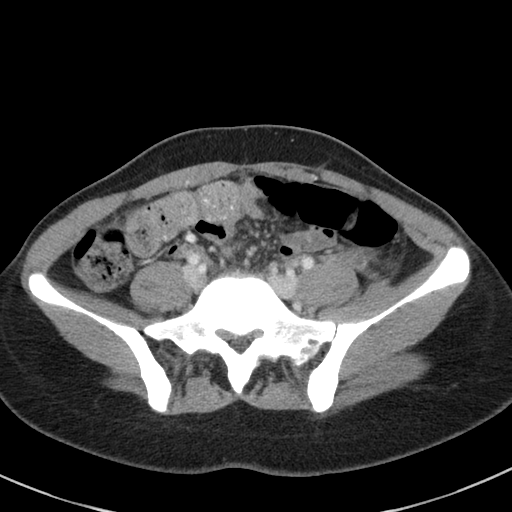
[im 43/93  soft-tissue]
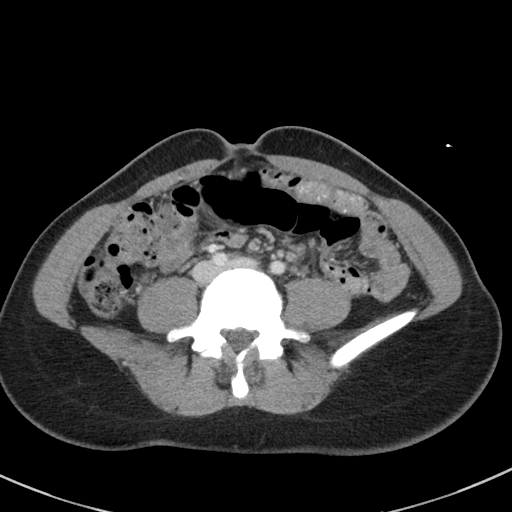
[im 50/93  soft-tissue]
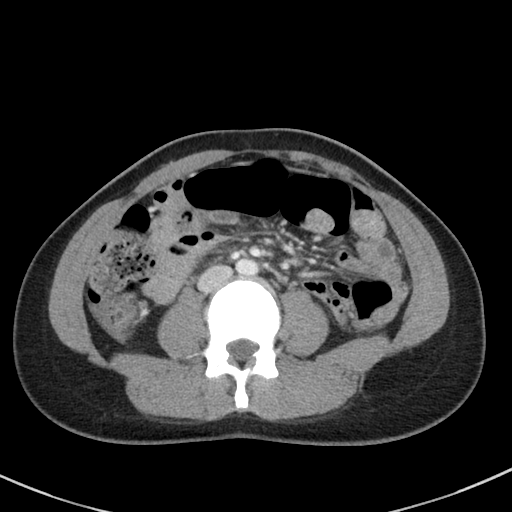
[im 57/93  soft-tissue]
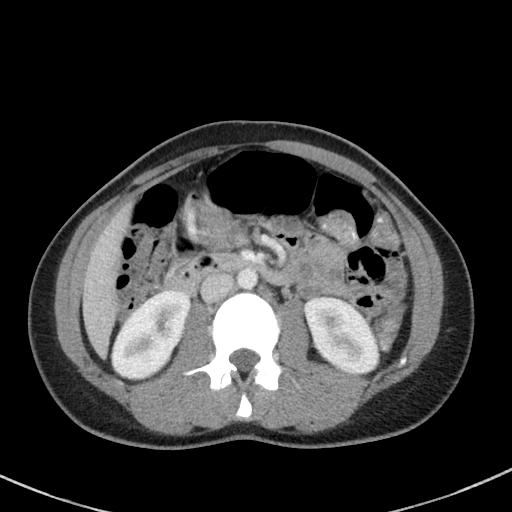
[im 64/93  soft-tissue]
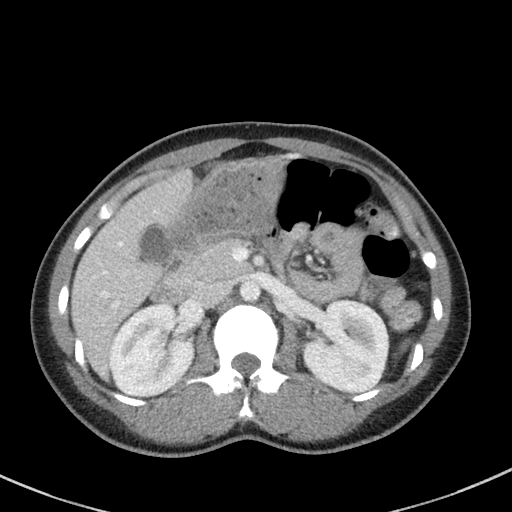
[im 64/93  bone]
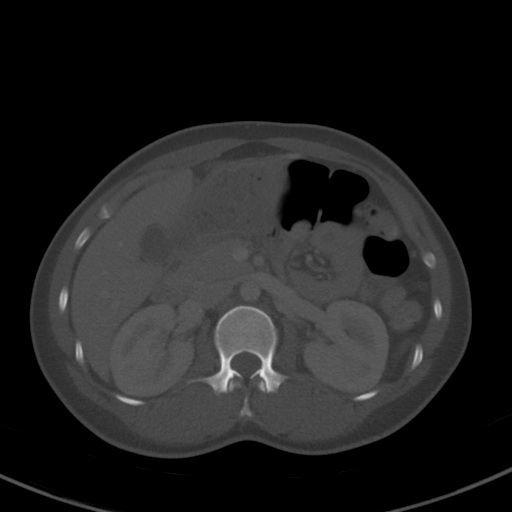
[im 71/93  soft-tissue]
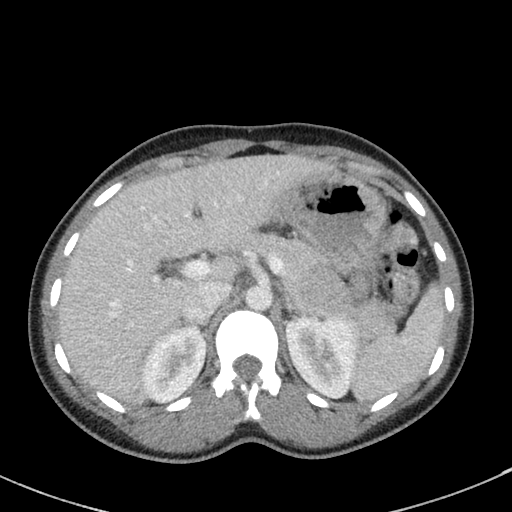
[im 78/93  soft-tissue]
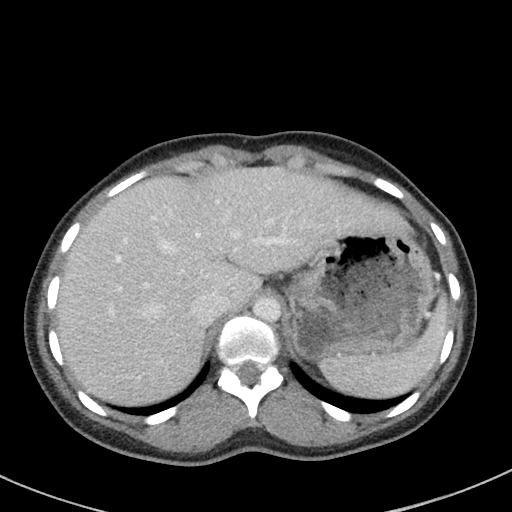
[im 85/93  soft-tissue]
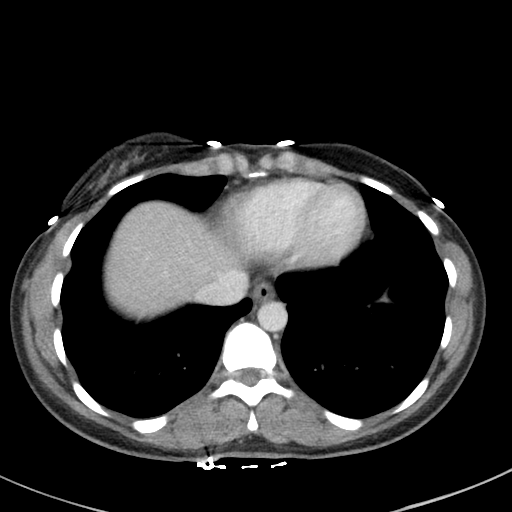

[Series 6: a/p w/ cor · coronal · 0.58mm/px · 3 of 145 slices shown]
[im 49/145  soft-tissue]
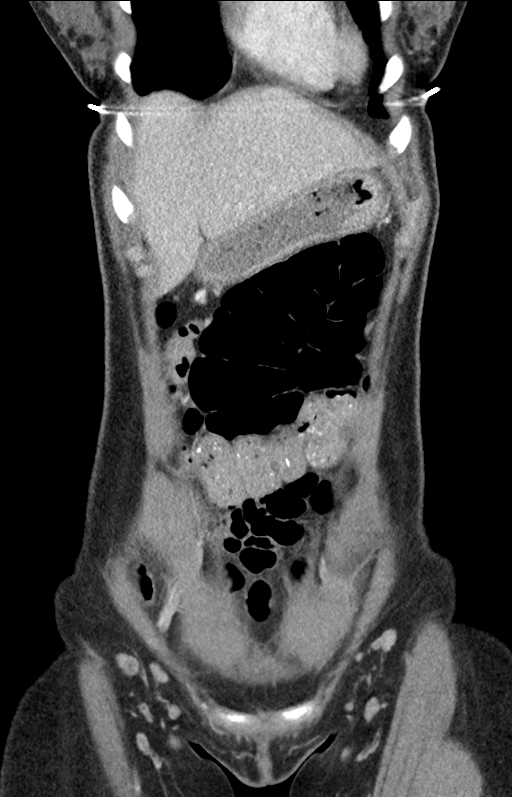
[im 65/145  soft-tissue]
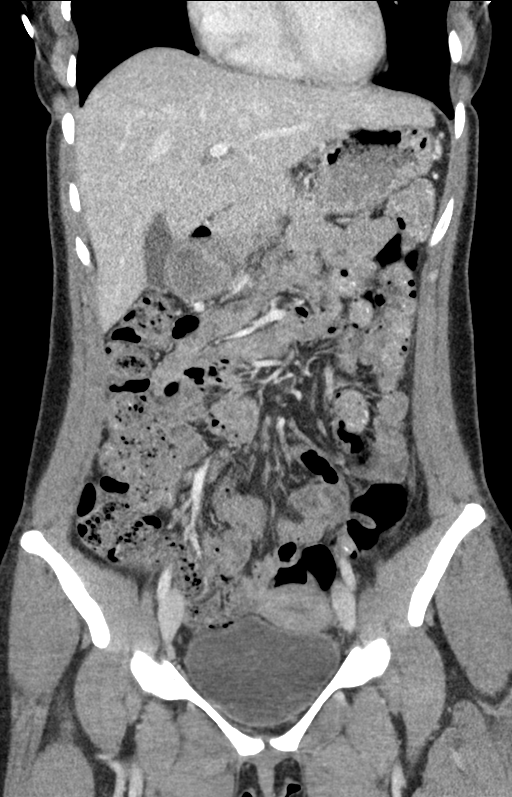
[im 81/145  soft-tissue]
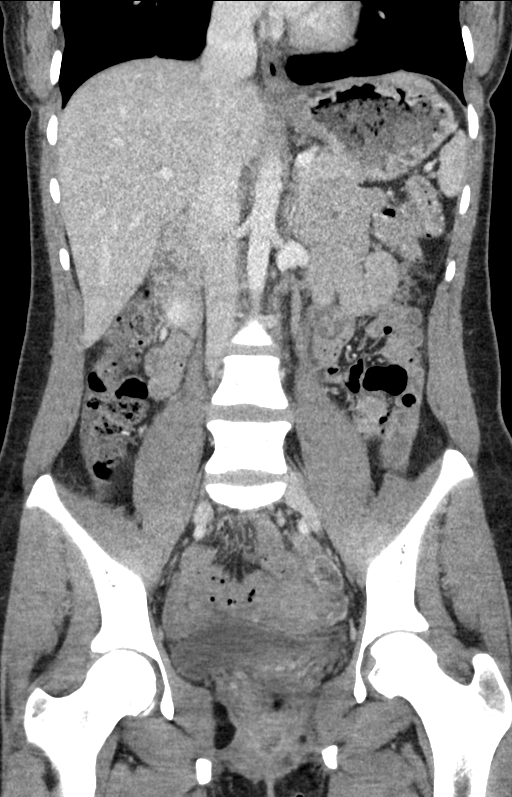

[15 of 46 positions shown; findings below may reference images not displayed]

FINDINGS: Lower chest: No acute abnormality.

Hepatobiliary: No focal liver abnormality is seen. No gallstones,
gallbladder wall thickening, or biliary dilatation.

Pancreas: Unremarkable. No pancreatic ductal dilatation or
surrounding inflammatory changes.

Spleen: Normal in size without focal abnormality.

Adrenals/Urinary Tract: Adrenal glands are unremarkable. Kidneys are
normal, without renal calculi, focal lesion, or hydronephrosis.
Bladder is unremarkable.

Stomach/Bowel: Stomach is within normal limits. Appendix appears
normal. No evidence of bowel wall thickening, distention, or
inflammatory changes.

Vascular/Lymphatic: No significant vascular findings are present. No
enlarged abdominal or pelvic lymph nodes.

Reproductive: Uterus and bilateral adnexa are unremarkable.

Other: No abdominal wall hernia or abnormality. No abdominopelvic
ascites.

Musculoskeletal: No acute or significant osseous findings.
IMPRESSION: No acute process identified. Unremarkable CT of the abdomen and
pelvis.

By: Ashima Hershey M.D.

## 2020-09-19 ENCOUNTER — Encounter (HOSPITAL_COMMUNITY): Payer: Self-pay

## 2020-09-19 ENCOUNTER — Other Ambulatory Visit: Payer: Self-pay

## 2020-09-19 ENCOUNTER — Emergency Department (HOSPITAL_COMMUNITY)
Admission: EM | Admit: 2020-09-19 | Discharge: 2020-09-19 | Disposition: A | Discharge status: Home / Self Care | Payer: BC Managed Care – PPO | Attending: Emergency Medicine | Admitting: Emergency Medicine

## 2020-09-19 ENCOUNTER — Telehealth (HOSPITAL_COMMUNITY): Payer: Self-pay | Admitting: Psychiatry

## 2020-09-19 DIAGNOSIS — F332 Major depressive disorder, recurrent severe without psychotic features: Secondary | ICD-10-CM | POA: Insufficient documentation

## 2020-09-19 DIAGNOSIS — Z20822 Contact with and (suspected) exposure to covid-19: Secondary | ICD-10-CM | POA: Insufficient documentation

## 2020-09-19 DIAGNOSIS — F419 Anxiety disorder, unspecified: Secondary | ICD-10-CM | POA: Diagnosis not present

## 2020-09-19 DIAGNOSIS — R45851 Suicidal ideations: Secondary | ICD-10-CM | POA: Diagnosis not present

## 2020-09-19 LAB — URINALYSIS, ROUTINE W REFLEX MICROSCOPIC
Bacteria, UA: NONE SEEN
Bilirubin Urine: NEGATIVE
Glucose, UA: NEGATIVE mg/dL
Ketones, ur: NEGATIVE mg/dL
Leukocytes,Ua: NEGATIVE
Nitrite: NEGATIVE
Protein, ur: NEGATIVE mg/dL
Specific Gravity, Urine: 1.014 (ref 1.005–1.030)
pH: 6 (ref 5.0–8.0)

## 2020-09-19 LAB — ACETAMINOPHEN LEVEL: Acetaminophen (Tylenol), Serum: <10 ug/mL — ABNORMAL LOW (ref 10–30)

## 2020-09-19 LAB — RAPID URINE DRUG SCREEN, HOSP PERFORMED
Amphetamines: NOT DETECTED
Barbiturates: NOT DETECTED
Benzodiazepines: NOT DETECTED
Cocaine: NOT DETECTED
Opiates: NOT DETECTED
Tetrahydrocannabinol: NOT DETECTED

## 2020-09-19 LAB — CBC WITH DIFFERENTIAL/PLATELET
Abs Immature Granulocytes: 0.01 10*3/uL (ref 0.00–0.07)
Basophils Absolute: 0 10*3/uL (ref 0.0–0.1)
Basophils Relative: 1 %
Eosinophils Absolute: 0.1 10*3/uL (ref 0.0–0.5)
Eosinophils Relative: 3 %
HCT: 35.4 % — ABNORMAL LOW (ref 36.0–46.0)
Hemoglobin: 11.1 g/dL — ABNORMAL LOW (ref 12.0–15.0)
Immature Granulocytes: 0 %
Lymphocytes Relative: 50 %
Lymphs Abs: 1.7 10*3/uL (ref 0.7–4.0)
MCH: 25.8 pg — ABNORMAL LOW (ref 26.0–34.0)
MCHC: 31.4 g/dL (ref 30.0–36.0)
MCV: 82.3 fL (ref 80.0–100.0)
Monocytes Absolute: 0.3 10*3/uL (ref 0.1–1.0)
Monocytes Relative: 10 %
Neutro Abs: 1.2 10*3/uL — ABNORMAL LOW (ref 1.7–7.7)
Neutrophils Relative %: 36 %
Platelets: 220 10*3/uL (ref 150–400)
RBC: 4.3 MIL/uL (ref 3.87–5.11)
RDW: 15.2 % (ref 11.5–15.5)
WBC: 3.3 10*3/uL — ABNORMAL LOW (ref 4.0–10.5)
nRBC: 0 % (ref 0.0–0.2)

## 2020-09-19 LAB — COMPREHENSIVE METABOLIC PANEL
ALT: 18 U/L (ref 0–44)
AST: 25 U/L (ref 15–41)
Albumin: 3.8 g/dL (ref 3.5–5.0)
Alkaline Phosphatase: 32 U/L — ABNORMAL LOW (ref 38–126)
Anion gap: 7 (ref 5–15)
BUN: 10 mg/dL (ref 6–20)
CO2: 23 mmol/L (ref 22–32)
Calcium: 8.7 mg/dL — ABNORMAL LOW (ref 8.9–10.3)
Chloride: 111 mmol/L (ref 98–111)
Creatinine, Ser: 0.76 mg/dL (ref 0.44–1.00)
GFR, Estimated: >60 mL/min (ref >60)
Glucose, Bld: 101 mg/dL — ABNORMAL HIGH (ref 70–99)
Potassium: 3.9 mmol/L (ref 3.5–5.1)
Sodium: 141 mmol/L (ref 135–145)
Total Bilirubin: 0.2 mg/dL — ABNORMAL LOW (ref 0.3–1.2)
Total Protein: 7.7 g/dL (ref 6.5–8.1)

## 2020-09-19 LAB — ETHANOL: Alcohol, Ethyl (B): 231 mg/dL — ABNORMAL HIGH (ref <10)

## 2020-09-19 LAB — RESP PANEL BY RT-PCR (FLU A&B, COVID) ARPGX2
Influenza A by PCR: NEGATIVE
Influenza B by PCR: NEGATIVE
SARS Coronavirus 2 by RT PCR: NEGATIVE

## 2020-09-19 LAB — PREGNANCY, URINE: Preg Test, Ur: NEGATIVE

## 2020-09-19 LAB — SALICYLATE LEVEL: Salicylate Lvl: <7 mg/dL — ABNORMAL LOW (ref 7.0–30.0)

## 2020-09-19 NOTE — ED Triage Notes (Signed)
Pt BIB [facility] from home for SI. ETOH today, EMS reports multiple liquor bottles at the house. Does not endorse specific plan but states "I want you to shoot me and I don't want to be in this world." Hx SI/anxiety. Father recently passed, mentioned 'being with him.' Screaming, slapping wall, hyperventilating. EMS gave 5mg  haldol. 20ga LH. NS.  BP 99/56 SpO2 97%  HR 79

## 2020-09-19 NOTE — ED Provider Notes (Signed)
[facility]-EMERGENCY DEPT Provider Note   CSN: [id number] Arrival date & time: 09/19/20  4163     History Chief Complaint  Patient presents with  . Suicidal    Heather Mccullough is a 30 y.o. female.  Patient is a 30 year old female with history of depression.  She is brought by EMS for evaluation of suicidal ideation.  Patient was consuming alcohol at her house this evening when a friend called 911 due to patient allegedly making suicidal threats.  When EMS arrived, patient thought they were police and requested that they shoot her.  Patient's father recently passed away and she made remarks about wanting to be with him.  Patient somewhat somnolent and appears intoxicated.  She is not very forthcoming with information at this time.  I am told she required Haldol by EMS due to uncooperativeness.  The history is provided by the patient.       Past Medical History:  Diagnosis Date  . Depression     Patient Active Problem List   Diagnosis Date Noted  . Unspecified mood (affective) disorder (HCC) 04/03/2015  . Depression   . Mood disorder (HCC)     History reviewed. No pertinent surgical history.   OB History   No obstetric history on file.     No family history on file.  Social History   Tobacco Use  . Smoking status: Never Smoker  . Smokeless tobacco: Never Used  Substance Use Topics  . Alcohol use: Yes    Comment: socially  . Drug use: No    Home Medications Prior to Admission medications   Medication Sig Start Date End Date Taking? Authorizing Provider  amoxicillin-clavulanate (AUGMENTIN) 875-125 MG tablet Take 1 tablet by mouth every 12 (twelve) hours. Patient not taking: Reported on 11/16/2017 01/29/17   Liberty Handy, PA-C  benzonatate (TESSALON) 100 MG capsule Take 1 capsule (100 mg total) by mouth every 8 (eight) hours. 08/24/18   Hedges, Tinnie Gens, PA-C  cephALEXin (KEFLEX) 500 MG capsule Take 1 capsule (500 mg total) by mouth 2  (two) times daily. 11/16/17   Hedges, Tinnie Gens, PA-C  ibuprofen (ADVIL,MOTRIN) 200 MG tablet Take 400 mg by mouth every 6 (six) hours as needed for moderate pain.    [provider]  metroNIDAZOLE (FLAGYL) 500 MG tablet Take 1 tablet (500 mg total) by mouth 2 (two) times daily. 11/16/17   Eyvonne Mechanic, PA-C    Allergies    Patient has no known allergies.  Review of Systems   Review of Systems  All other systems reviewed and are negative.   Physical Exam Updated Vital Signs BP 105/65 (BP Location: Right Arm)   Pulse 79   Temp 98.4 F (36.9 C) (Oral)   Resp 18   Ht 5\' 9"  (1.753 m)   SpO2 99%   BMI 22.15 kg/m   Physical Exam Vitals and nursing note reviewed.  Constitutional:      General: She is not in acute distress.    Appearance: She is well-developed. She is not diaphoretic.  HENT:     Head: Normocephalic and atraumatic.  Cardiovascular:     Rate and Rhythm: Normal rate and regular rhythm.     Heart sounds: No murmur heard. No friction rub. No gallop.   Pulmonary:     Effort: Pulmonary effort is normal. No respiratory distress.     Breath sounds: Normal breath sounds. No wheezing.  Abdominal:     General: Bowel sounds are normal. There is  no distension.     Palpations: Abdomen is soft.     Tenderness: There is no abdominal tenderness.  Musculoskeletal:        General: Normal range of motion.     Cervical back: Normal range of motion and neck supple.  Skin:    General: Skin is warm and dry.  Neurological:     Mental Status: She is alert and oriented to person, place, and time.  Psychiatric:        Attention and Perception: She is inattentive.        Mood and Affect: Affect is flat.        Speech: Speech is delayed.        Behavior: Behavior is withdrawn.        Thought Content: Thought content includes suicidal ideation. Thought content does not include homicidal ideation. Thought content does not include homicidal or suicidal plan.        Judgment:  Judgment is impulsive.     ED Results / Procedures / Treatments   Labs (all labs ordered are listed, but only abnormal results are displayed) Labs Reviewed  COMPREHENSIVE METABOLIC PANEL  ETHANOL  ACETAMINOPHEN LEVEL  SALICYLATE LEVEL  CBC WITH DIFFERENTIAL/PLATELET  URINALYSIS, ROUTINE W REFLEX MICROSCOPIC  RAPID URINE DRUG SCREEN, HOSP PERFORMED  PREGNANCY, URINE    EKG None  Radiology No results found.  Procedures Procedures   Medications Ordered in ED Medications - No data to display  ED Course  I have reviewed the triage vital signs and the nursing notes.  Pertinent labs & imaging results that were available during my care of the patient were reviewed by me and considered in my medical decision making (see chart for details).    MDM Rules/Calculators/A&P  Patient brought by EMS for evaluation of suicidal ideation and threats made while intoxicated.  Patient's blood alcohol is 231 and she is quite somnolent after receiving Haldol by EMS for agitation/combativeness.  Patient will be assessed by TTS once sober.  Care signed out to Dr. Clarice Pole at shift change.  She will obtain the results of the TTS consultation and determine the final disposition.  Final Clinical Impression(s) / ED Diagnoses Final diagnoses:  None    Rx / DC Orders ED Discharge Orders    None       Geoffery Lyons, MD 09/19/20 2324

## 2020-09-19 NOTE — BH Assessment (Signed)
[facility] Assessment Progress Note  Per Vernard Gambles, NP, this voluntary pt does not require psychiatric hospitalization at this time.  Pt is psychiatrically cleared.  Pt is reportedly interested in outpatient treatment services offered by [facility], but is not prepared to commit to any of them today.  This Clinical research associate has spoken to pt in person about these services and I have also included a brief description of all of them in pt's discharge instructions.  I have, further, given her a registration packet for the clinic, advising her to fill it out before any intake appointments that she schedules at her own initiative.  EDP Arby Barrette, MD and pt's nurse, Tobi Bastos, have been notified.  [name], MA Triage Specialist [phone]

## 2020-09-19 NOTE — ED Notes (Signed)
Went to give patient her lunch tray and she refused to take it. She said she was not hungry.

## 2020-09-19 NOTE — BH Assessment (Addendum)
TTS Discharge Recommendations:   Per Heather Gambles, NP, patient is psych cleared. Recommended to follow up with Psych IOP and/or IOP. Clinician and provider discussed options with patient via telepsych. She was reluctant to make any decisions today. However, agreeable to take referral information to the [facility] outpatient. Also, agreeable to connect or receive a call from the case manager Heather Mccullough) to discuss details of the programs further. Discussed with patient options for outpatient therapy and  medication management if IOP is determined not to be an option for her. She was agreeable to considering those options. Per provider request, outpatient casemanager Heather Mccullough to be notified and requested to follow up with patient regarding IOP options. Heather Mccullough sent a secure chat with the request and has followed up with patient by leaving a voicemail. EDP Arby Barrette, MD and pt's nurse, Heather Mccullough, have been notified of patients updated disposition, provider recommendations, and sitter recommendations. Heather Mccullough was provided a list of referral information to the available programs discussed above as noted.

## 2020-09-19 NOTE — ED Notes (Signed)
Pt ambulated to bathroom with no assistance.  

## 2020-09-19 NOTE — BH Assessment (Addendum)
Comprehensive Clinical Assessment (CCA) Note  09/19/2020 [facility] [id number]   TTS Discharge Recommendations:  Per Vernard Gambles, NP, patient is psych cleared. Recommended to follow up with Psych IOP and/or IOP. Clinician and provider discussed options with patient via telepsych. She was reluctant to make any decisions today. However, agreeable to take referral information to the [facility] outpatient. Also, agreeable to connect or receive a call from the case manager Jeri Modena) to discuss details of the programs further. Discussed with patient options for outpatient therapy and  medication management if IOP is determined not to be an option for her. She was agreeable to considering those options. Per provider request, outpatient casemanager Jeri Modena to be notified and requested to follow up with patient regarding IOP options. Jeri Modena sent a secure chat with the request and has followed up with patient by leaving a voicemail. EDP Arby Barrette, MD and pt's nurse, Tobi Bastos, have been notified of patients updated disposition, provider recommendations, and sitter recommendations. [name] was provided a list of referral information to the available programs discussed above as noted.   The patient demonstrates the following risk factors for suicide: Chronic risk factors for suicide include: psychiatric disorder of Major Depressive Disorder and Anxiety Disorder . Acute risk factors for suicide include: family or marital conflict, social withdrawal/isolation and loss (financial, interpersonal, professional). Protective factors for this patient include: religious beliefs against suicide. Considering these factors, the overall suicide risk at this point appears to be "Low Risk". Patient is appropriate for outpatient follow up.  Based on the [place]-SUICIDE SEVERITY RATING SCALE (C-SSRS), patient is "low risk". Therefore, 1:1 screening options are not recommended at this time.  Flowsheet Row ED  from 09/19/2020 in [facility]  C-SSRS RISK CATEGORY Low Risk     Patient is a 30 year old female with history of depression. Upon chart review: "She is brought by EMS for evaluation of suicidal ideation.  Patient was consuming alcohol at her house this evening when a friend called 911 due to patient allegedly making suicidal threats.  When EMS arrived, patient thought they were police and requested that they shoot her.  Patient's father recently passed away and she made remarks about wanting to be with him.  Patient somewhat somnolent and appears intoxicated.  She is not very forthcoming with information at this time.  I am told she required Haldol by EMS due to uncooperativeness."  Clinician meet with patient to complete a TTS assessment via tele psych. Patient presents to [facility] States that stress has become a major issue for her.  States that yesterday her stressors became overwhelming. She started "breathing hard", "Punching, and "Kicking". Therefore, her friend called 911 for help. Patient believes that she was experiencing a panic attack. She denies a history of anxiety beyond this episode.  Patient denies current suicidal thoughts. However, patient acknowledges that she made comments about dying yesterday. Also, knowledges that she wanted someone to shoot her yesterday. She attributes her stressors as reason for her comments. Also, alcohol. She explains discord with family x3 months. States that family moved out of state 3 months ago and she didn't want to go with them. She decided to stay [facility] while her family moved to [facility]. Due to making the decision not to move with family her family his rejecting her. They will not communicate with her at all.  Upon chart review, patient  presented to the ED in the distant past with suicidal thoughts and depression. Protective factors  include her religion. States, "When I am sad I think of the things that I have  and pray to God".  Denies access to means such as firearms.    Current depressive symptoms: anger/irritability, hopelessness, worthlessness, tearful, guilt loss of interest in usual pleasures.  She sleeps 6-7 hrs per night. Appetite is poor. No significant weight loss and/or gain. Patient is currently employed and states, "I work at a [employer]", 2 years. States that he job has not been affected by her depressive symptoms. Highest level of education. Highest level of education. She is single and has not children.   Patient denies homicidal ideations. No history of violate and/or aggressive behaviors. No legal issues and/or court dates. Denies AVH's. Denies drug use. However, reports drinking alcohol. She started drinking alcohol at the age of 30 yrs old. She typically drinks (liquor) on the weekends. She reports taking shots but does not the amount that she uses. Duration of alcohol use has been 2-3 yrs.  No reported withdrawal symptoms. No history of seizures and/or DT's. No family history of substance use.   Denies having an outpatient therapist and/or psychiatrist. She is not prescribed any medications to assist with mood stabilization. She does not have a PCP at this time.   Chief Complaint:  Chief Complaint  Patient presents with  . Suicidal   Visit Diagnosis: Major Depressive Disorder, Recurrent, Severe, without psychotic features and Anxiety Disorder   CCA Screening, Triage and Referral (STR)  Patient Reported Information How did you hear about [place]? Other (Comment)  Referral name: No data recorded Referral phone number: No data recorded  Whom do you see for routine medical problems? No data recorded Practice/Facility Name: No data recorded Practice/Facility Phone Number: No data recorded Name of Contact: No data recorded Contact Number: No data recorded Contact Fax Number: No data recorded Prescriber Name: No data recorded Prescriber Address (if known): No data  recorded  What Is the Reason for Your Visit/Call Today? EMS called by patient's friend  How Long Has This Been Causing You Problems? > than 6 months  What Do You Feel Would Help You the Most Today? Treatment for Depression or other mood problem; Stress Management   Have You Recently Been in Any Inpatient Treatment ([facility]28-Day Program)? No data recorded Name/Location of Program/[facility]:No data recorded How Long Were You There? No data recorded When Were You Discharged? No data recorded  Have You Ever Received Services From [facility] Before? No data recorded Who Do You See at [facility]? No data recorded  Have You Recently Had Any Thoughts About Hurting Yourself? No (Patient denies)  Are You Planning to Commit Suicide/Harm Yourself At This time? No   Have you Recently Had Thoughts About Hurting Someone Karolee Ohs? No  Explanation: No data recorded  Have You Used Any Alcohol or Drugs in the Past 24 Hours? Yes  How Long Ago Did You Use Drugs or Alcohol? No data recorded What Did You Use and How Much? Patient states, "I do not remember". States that she did drink at least a "couple of shots".   Do You Currently Have a Therapist/Psychiatrist? No data recorded Name of Therapist/Psychiatrist: No data recorded  Have You Been Recently Discharged From Any Office Practice or Programs? No data recorded Explanation of Discharge From Practice/Program: No data recorded    CCA Screening Triage Referral Assessment Type of Contact: No data recorded Is this Initial or Reassessment? No data recorded Date Telepsych consult ordered in [facility]:  No data recorded  Time Telepsych consult ordered in [facility]:  No data recorded  Patient Reported Information Reviewed? No data recorded Patient Left Without Being Seen? No data recorded Reason for Not Completing Assessment: No data recorded  Collateral Involvement: No data recorded  Does Patient Have a Court Appointed Legal Guardian?  No data recorded Name and Contact of Legal Guardian: No data recorded If Minor and Not Living with Parent(s), Who has Custody? No data recorded Is CPS involved or ever been involved? No data recorded Is APS involved or ever been involved? No data recorded  Patient Determined To Be At Risk for Harm To Self or Others Based on Review of Patient Reported Information or Presenting Complaint? No data recorded Method: No data recorded Availability of Means: No data recorded Intent: No data recorded Notification Required: No data recorded Additional Information for Danger to Others Potential: No data recorded Additional Comments for Danger to Others Potential: No data recorded Are There Guns or Other Weapons in Your Home? No data recorded Types of Guns/Weapons: No data recorded Are These Weapons Safely Secured?                            No data recorded Who Could Verify You Are Able To Have These Secured: No data recorded Do You Have any Outstanding Charges, Pending Court Dates, Parole/Probation? No data recorded Contacted To Inform of Risk of Harm To Self or Others: No data recorded  Location of Assessment: No data recorded  Does Patient Present under Involuntary Commitment? No data recorded IVC Papers Initial File Date: No data recorded  [place] data recorded  Patient Currently Receiving the Following Services: No data recorded  Determination of Need: Emergent (2 hours)   Options For Referral: Medication Management; Intensive Outpatient Therapy; Partial Hospitalization     CCA Biopsychosocial Intake/Chief Complaint:  Patietn BIB [facility] from home for SI. ETOH today, EMS reports multiple liquor bottles at the house. Does not endorse specific plan but states "I want you to shoot me and I don't want to be in this world." Hx SI/anxiety. Father recently passed, mentioned 'being with him.' Screaming, slapping wall, hyperventilating. EMS gave 5mg  haldol.  Current  Symptoms/Problems: Current depressive symptoms: anger/irritability, hopelessness, worthlessness, tearful, guilt loss of interest in usual pleasures.   Patient Reported Schizophrenia/Schizoaffective Diagnosis in Past: No   Strengths: communicates symptoms to this clinician  Preferences: patient did not have any preferences  Abilities: seek treatment; reach out to support system for help   Type of Services Patient Feels are Needed: "I don't know what will help me".   Initial Clinical Notes/Concerns: Patient is a 30 year old female with history of depression.  She is brought by EMS for evaluation of suicidal ideation.  Patient was consuming alcohol at her house this evening when a friend called 911 due to patient allegedly making suicidal threats.  When EMS arrived, patient thought they were police and requested that they shoot her.  Patient's father recently passed away and she made remarks about wanting to be with him.  Patient somewhat somnolent and appears intoxicated.  She is not very forthcoming with information at this time.  I am told she required Haldol by EMS due to uncooperativeness   Mental Health Symptoms Depression:  Difficulty Concentrating; Hopelessness; Change in energy/activity; Increase/decrease in appetite; Irritability; Tearfulness   Duration of Depressive symptoms: Greater than two weeks   Mania:  Change in energy/activity; Increased Energy; Irritability; Racing thoughts; Recklessness  Anxiety:   Difficulty concentrating; Irritability; Restlessness; Worrying   Psychosis:  None   Duration of Psychotic symptoms: No data recorded  Trauma:  None   Obsessions:  None   Compulsions:  None   Inattention:  None   Hyperactivity/Impulsivity:  N/A   Oppositional/Defiant Behaviors:  None   Emotional Irregularity:  Mood lability; Chronic feelings of emptiness   Other Mood/Personality Symptoms:  Current depressive symptoms: anger/irritability, hopelessness,  worthlessness, tearful, guilt loss of interest in usual pleasures. Initially guarded. However, opened up regarding her feelings the later part to the TTS assessment.    Mental Status Exam Appearance and self-care  Stature:  Average   Weight:  Average weight   Clothing:  Disheveled   Grooming:  Normal   Cosmetic use:  None   Posture/gait:  Normal   Motor activity:  Not Remarkable   Sensorium  Attention:  Normal   Concentration:  Normal   Orientation:  Object; Person; Place; Situation; Time; X5   Recall/memory:  Normal   Affect and Mood  Affect:  Appropriate   Mood:  Depressed   Relating  Eye contact:  None   Facial expression:  Sad; Tense   Attitude toward examiner:  Cooperative   Thought and Language  Speech flow: Clear and Coherent   Thought content:  Appropriate to Mood and Circumstances   Preoccupation:  None   Hallucinations:  None   Organization:  No data recorded  [employer] of Knowledge:  Average   Intelligence:  Average   Abstraction:  Concrete   Judgement:  Good   Reality Testing:  Adequate   Insight:  No data recorded  Decision Making:  Normal   Social Functioning  Social Maturity:  Isolates   Social Judgement:  Normal   Stress  Stressors:  Family conflict   Coping Ability:  Exhausted   Skill Deficits:  Communication; Interpersonal   Supports:  Friends/Service system     Religion: Religion/Spirituality Are You A Religious Person?: Yes What is Your Religious Affiliation?:  (Unknown; however noted her belief in God as a protective factor.) How Might This Affect Treatment?: unnown  Leisure/Recreation: Leisure / Recreation Do You Have Hobbies?:  (unknown)  Exercise/Diet: Exercise/Diet Do You Exercise?: Yes What Type of Exercise Do You Do?:  (no specific excercises reported) How Many Times a Week Do You Exercise?:  (unknown) Have You Gained or Lost A Significant Amount of Weight in the Past Six Months?:  No Do You Follow a Special Diet?: No Do You Have Any Trouble Sleeping?: Yes Explanation of Sleeping Difficulties: 6-7 hrs per night   CCA Employment/Education Employment/Work Situation: Employment / Work Situation Employment situation: Employed 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 long has patient been employed?: 2-3 yrs Patient's job has been impacted by current illness: No What is the longest time patient has a held a job?: unknown Where was the patient employed at that time?: unknown Has patient ever been in the [employer]?: No  Education: Education Is Patient Currently Attending School?: No Last Grade Completed:  (12th grade) Name of High School: unknown Did Garment/textile technologist From [employer]?: Yes Did Theme park manager?: No Did You Attend Graduate School?: No Did You Have Any Special Interests In School?: unknown Did You Have An Individualized Education Program (IIEP): No Did You Have Any Difficulty At School?: No Patient's Education Has Been Impacted by Current Illness: No   CCA Family/Childhood History Family and Relationship History: Family history Marital  status: Single Are you sexually active?:  (unknown) What is your sexual orientation?: heterosexual Has your sexual activity been affected by drugs, alcohol, medication, or emotional stress?: unknown Does patient have children?: No  Childhood History:  Childhood History By whom was/is the patient raised?: Mother Additional childhood history information: none reported Description of patient's relationship with caregiver when they were a child: relationship is estranged with mother Patient's description of current relationship with people who raised him/her: relationship is estranged with mother How were you disciplined when you got in trouble as a child/adolescent?: unknown Does patient have siblings?: Yes Number of Siblings:  (3 brothers) Description of patient's current  relationship with siblings: estranged at this time. Did patient suffer any verbal/emotional/physical/sexual abuse as a child?: No Did patient suffer from severe childhood neglect?: No Has patient ever been sexually abused/assaulted/raped as an adolescent or adult?: No Was the patient ever a victim of a crime or a disaster?: No Witnessed domestic violence?: No Has patient been affected by domestic violence as an adult?: No  Child/Adolescent Assessment:     CCA Substance Use Alcohol/Drug Use: Alcohol / Drug Use Pain Medications: See MAR  Prescriptions: See MAR  Over the Counter: See MAR  History of alcohol / drug use?: Yes Longest period of sobriety (when/how long): None  Substance #1 Name of Substance 1: Alcohol 1 - Age of First Use: 30 yrs old 1 - Amount (size/oz): "I can't really count the amount of alcohol...it may be a few shots" 1 - Frequency: "Weekends with friends" 1 - Duration: on-going 1 - Last Use / Amount: 09/18/20 1 - Method of Aquiring: Provided by friends at parties that she attends on the weekend 1- Route of Use: oral                       ASAM's:  Six Dimensions of Multidimensional Assessment  Dimension 1:  Acute Intoxication and/or Withdrawal Potential:      Dimension 2:  Biomedical Conditions and Complications:      Dimension 3:  Emotional, Behavioral, or Cognitive Conditions and Complications:     Dimension 4:  Readiness to Change:     Dimension 5:  Relapse, Continued use, or Continued Problem Potential:     Dimension 6:  Recovery/Living Environment:     ASAM Severity Score:    ASAM Recommended Level of Treatment:     Substance use Disorder (SUD) Substance Use Disorder (SUD)  Checklist Symptoms of Substance Use: Continued use despite having a persistent/recurrent physical/psychological problem caused/exacerbated by use  Recommendations for Services/Supports/Treatments: Recommendations for Services/Supports/Treatments Recommendations For  Services/Supports/Treatments: IOP (Intensive Outpatient Program),Medication Management,Individual Therapy  DSM5 Diagnoses: Patient Active Problem List   Diagnosis Date Noted  . Unspecified mood (affective) disorder (HCC) 04/03/2015  . Depression   . Mood disorder [facility])     Patient Centered Plan: Patient is on the following Treatment Plan(s):  Anxiety and Depression   Referrals to Alternative Service(s): Referred to Alternative Service(s):   Place:   Date:   Time:    Referred to Alternative Service(s):   Place:   Date:   Time:    Referred to Alternative Service(s):   Place:   Date:   Time:    Referred to Alternative Service(s):   Place:   Date:   Time:     Melynda Ripple, Counselor

## 2020-09-19 NOTE — ED Notes (Signed)
Pt given breakfast tray

## 2020-09-19 NOTE — Discharge Instructions (Addendum)
For your behavioral health needs, you are advised to follow up with the [facility] at [facility].  They offer routine psychiatry and therapy services by appointment.  Due to Covid-19, these services are currently being offered virtually.  They also offer several intermediate levels of care for you to consider:       Partial Hospitalization Program Due to Covid-19 this program is currently virtual.  Program meets Monday - Friday from 9:00 am - 1:00 pm, usually for two weeks.  Group therapy with weekly psychiatry.  This is indicated for individuals that need medication management, as well as therapy.  Contact person is Donia Guiles, [place] at [phone]       Mental Health Intensive Outpatient Program Due to Covid-19 this program is currently virtual.  Program meets Monday - Friday from 9:00 am - 12:00 pm, usually for two weeks.  Primarily group therapy with some psychiatry.  For this reason this program is NOT indicated for primary medication management needs.  Contact person is Jeri Modena, [place] at [phone]       Substance Abuse Intensive Outpatient Program This program is in-person.  Program meets Monday - Wednesday - Thursday from 1:00 pm - 4:00 pm.  Primarily group therapy with some psychiatry.  Oriented toward helping individuals maintain sobriety off of alcohol and drugs (including some prescription medications).  Participants are expected to attend 12-Step meetings on days when the program does not meet.  Contact person is Fulton Reek, LCSW at [phone]  For additional details about any of these programs, please call the individuals listed above.       [facility] at [facility]. [address]      [place], [place] [address]      [phone]

## 2021-12-24 ENCOUNTER — Encounter (HOSPITAL_COMMUNITY): Payer: Self-pay | Admitting: Emergency Medicine

## 2021-12-24 ENCOUNTER — Ambulatory Visit (HOSPITAL_COMMUNITY)
Admission: EM | Admit: 2021-12-24 | Discharge: 2021-12-24 | Disposition: A | Discharge status: Home / Self Care | Payer: Managed Care, Other (non HMO) | Attending: Internal Medicine | Admitting: Internal Medicine

## 2021-12-24 DIAGNOSIS — Z79899 Other long term (current) drug therapy: Secondary | ICD-10-CM | POA: Diagnosis not present

## 2021-12-24 DIAGNOSIS — J069 Acute upper respiratory infection, unspecified: Secondary | ICD-10-CM | POA: Diagnosis not present

## 2021-12-24 DIAGNOSIS — Z20822 Contact with and (suspected) exposure to covid-19: Secondary | ICD-10-CM | POA: Diagnosis not present

## 2021-12-24 DIAGNOSIS — R0981 Nasal congestion: Secondary | ICD-10-CM | POA: Insufficient documentation

## 2021-12-24 LAB — SARS CORONAVIRUS 2 BY RT PCR: SARS Coronavirus 2 by RT PCR: NEGATIVE

## 2021-12-24 MED ORDER — IBUPROFEN 800 MG PO TABS: 800.0000 mg | ORAL_TABLET | Freq: Three times a day (TID) | ORAL | 0 refills | Status: DC

## 2021-12-24 MED ORDER — ACETAMINOPHEN 500 MG PO TABS: 1000.0000 mg | ORAL_TABLET | Freq: Four times a day (QID) | ORAL | 0 refills | Status: AC | PRN

## 2021-12-24 MED ORDER — IBUPROFEN 800 MG PO TABS
800.0000 mg | ORAL_TABLET | Freq: Once | ORAL | Status: AC
  Administered 2021-12-24: 800 mg via ORAL

## 2021-12-24 MED ORDER — GUAIFENESIN ER 1200 MG PO TB12: 1200.0000 mg | ORAL_TABLET | Freq: Two times a day (BID) | ORAL | 0 refills | Status: AC

## 2021-12-24 MED ORDER — IBUPROFEN 800 MG PO TABS
ORAL_TABLET | ORAL | Status: AC
  Filled 2021-12-24: qty 1

## 2021-12-24 NOTE — ED Triage Notes (Addendum)
Pt reports central chest pain starting Saturday along with nausea, headache, generalized body aches and nasal congestion. States this is how she felt the last time she had covid. Took aleve yesterday to help with symptoms

## 2021-12-24 NOTE — Discharge Instructions (Addendum)
You have a viral upper respiratory infection.  COVID-19 testing is pending. We will call you if your result is positive.   Take guaifenesin 1200mg   2 times daily to thin your mucous so that you can cough it up and blow it out of your nose easier. Drink plenty of water while taking this medication so that it works well in your body (at least 8 cups a day).   You may take tylenol 1,000mg  and ibuprofen 800 mg every 8 hours with food as needed for fever/chills, sore throat, aches/pains, and inflammation associated with viral illness. Take this with food to avoid stomach upset.    Do not take naproxen/aleve while taking ibuprofen since it is a similar medication.   Your next dose of tylenol may be when you get home.  Your next dose of ibuprofen may be at 1am tomorrow.   You may do salt water and baking soda gargles every 4 hours as needed for your throat pain.  Please put 1 teaspoon of salt and 1/2 teaspoon of baking soda in 8 ounces of warm water then gargle and spit the water out. You may also put 1 tablespoon of honey in warm water and drink this to soothe your throat.  Place a humidifier in your room at night to help decrease dry air that can irritate your airway and cause you to have a sore throat and cough.  Please try to eat a well-balanced diet while you are sick so that your body gets proper nutrition to heal.  If you develop any new or worsening symptoms, please return.  If your symptoms are severe, please go to the emergency room.  Follow-up with your primary care provider for further evaluation and management of your symptoms as well as ongoing wellness visits.  I hope you feel better!

## 2021-12-24 NOTE — ED Provider Notes (Signed)
[facility]    CSN: [id number] Arrival date & time: 12/24/21  1607      History   Chief Complaint Chief Complaint  Patient presents with   Nasal Congestion   Chest Pain   Cough    HPI Heather Mccullough is a 31 y.o. female.   Patient presents urgent care for evaluation of nasal congestion, body aches, fatigue, generalized headache, and intermittent nausea over the last 3 days since Saturday, December 21, 2021.  Denies cough, vomiting, abdominal pain, ear pain, blurry vision, decreased visual acuity, shortness of breath, urinary symptoms, back pain, and dizziness.  Nasal congestion consists of thick yellow mucus.  Patient states that her throat feels "scratchy" but is not coughing.  Reports generalized body aches and weakness.  States she was not even able to get out of bed on the first day of her symptoms and this feels very similar to the last time that she had COVID.   Reporting intermittent chest discomfort that is to the center/right side of her chest that comes and goes. Denies esophageal burning or acid reflux. Denies history of cardiac problems. She is not currently experiencing any chest discomfort at time of exam. Headache is currently generalized and a 7 on a scale of 0-10.  She is not a smoker, denies drug use, and denies history of asthma.  She has been taking Aleve at home with some relief of her symptoms.  Last dose of Aleve was last night.  She has not had any antipyretic medication today.    Chest Pain Associated symptoms: cough   Cough Associated symptoms: chest pain     Past Medical History:  Diagnosis Date   Depression     Patient Active Problem List   Diagnosis Date Noted   Unspecified mood (affective) disorder (HCC) 04/03/2015   Depression    Mood disorder (HCC)     History reviewed. No pertinent surgical history.  OB History   No obstetric history on file.      Home Medications    Prior to Admission medications   Medication Sig  Start Date End Date Taking? Authorizing Provider  acetaminophen (TYLENOL) 500 MG tablet Take 2 tablets (1,000 mg total) by mouth every 6 (six) hours as needed. 12/24/21  Yes Carlisle Beers, FNP  Guaifenesin 1200 MG TB12 Take 1 tablet (1,200 mg total) by mouth in the morning and at bedtime. 12/24/21  Yes Carlisle Beers, FNP  ibuprofen (ADVIL) 800 MG tablet Take 1 tablet (800 mg total) by mouth 3 (three) times daily. 12/24/21  Yes Carlisle Beers, FNP  amoxicillin-clavulanate (AUGMENTIN) 875-125 MG tablet Take 1 tablet by mouth every 12 (twelve) hours. Patient not taking: Reported on 11/16/2017 01/29/17   Liberty Handy, PA-C  benzonatate (TESSALON) 100 MG capsule Take 1 capsule (100 mg total) by mouth every 8 (eight) hours. 08/24/18   Hedges, Tinnie Gens, PA-C  cephALEXin (KEFLEX) 500 MG capsule Take 1 capsule (500 mg total) by mouth 2 (two) times daily. 11/16/17   Hedges, Tinnie Gens, PA-C  metroNIDAZOLE (FLAGYL) 500 MG tablet Take 1 tablet (500 mg total) by mouth 2 (two) times daily. 11/16/17   Eyvonne Mechanic, PA-C    Family History History reviewed. No pertinent family history.  Social History Social History   Tobacco Use   Smoking status: Never   Smokeless tobacco: Never  Substance Use Topics   Alcohol use: Yes    Comment: socially   Drug use: No     Allergies  Patient has no known allergies.   Review of Systems Review of Systems  Respiratory:  Positive for cough.   Cardiovascular:  Positive for chest pain.  Per HPI   Physical Exam Triage Vital Signs ED Triage Vitals  Enc Vitals Group     BP 12/24/21 1621 116/78     Pulse Rate 12/24/21 1621 88     Resp 12/24/21 1621 16     Temp 12/24/21 1621 98.2 F (36.8 C)     Temp Source 12/24/21 1621 Oral     SpO2 12/24/21 1621 97 %     Weight --      Height --      Head Circumference --      Peak Flow --      Pain Score 12/24/21 1622 7     Pain Loc --      Pain Edu? --      Excl. in GC? --    No data  found.  Updated Vital Signs BP 116/78 (BP Location: Right Arm)   Pulse 88   Temp 98.2 F (36.8 C) (Oral)   Resp 16   SpO2 97%   Visual Acuity Right Eye Distance:   Left Eye Distance:   Bilateral Distance:    Right Eye Near:   Left Eye Near:    Bilateral Near:     Physical Exam Vitals and nursing note reviewed.  Constitutional:      Appearance: Normal appearance. She is not ill-appearing or toxic-appearing.     Comments: Patient sitting in comfortable position on exam table in no acute distress.  Patient is wearing multiple layers of clothing and appears very cold.  HENT:     Head: Normocephalic and atraumatic.     Right Ear: Hearing and external ear normal.     Left Ear: Hearing and external ear normal.     Nose: Nose normal.     Mouth/Throat:     Lips: Pink.     Mouth: Mucous membranes are moist.  Eyes:     General: Lids are normal. Vision grossly intact. Gaze aligned appropriately.     Extraocular Movements: Extraocular movements intact.     Conjunctiva/sclera: Conjunctivae normal.  Cardiovascular:     Rate and Rhythm: Normal rate and regular rhythm.     Heart sounds: Normal heart sounds, S1 normal and S2 normal.  Pulmonary:     Effort: Pulmonary effort is normal. No respiratory distress.     Breath sounds: Normal breath sounds and air entry.     Comments: Clear to auscultation bilaterally. Abdominal:     Palpations: Abdomen is soft.  Musculoskeletal:     Cervical back: Neck supple.  Skin:    General: Skin is warm and dry.     Capillary Refill: Capillary refill takes less than 2 seconds.     Findings: No rash.  Neurological:     General: No focal deficit present.     Mental Status: She is alert and oriented to person, place, and time. Mental status is at baseline.     Cranial Nerves: No dysarthria or facial asymmetry.     Gait: Gait is intact.  Psychiatric:        Mood and Affect: Mood normal.        Speech: Speech normal.        Behavior: Behavior normal.         Thought Content: Thought content normal.        Judgment: Judgment normal.  UC Treatments / Results  Labs (all labs ordered are listed, but only abnormal results are displayed) Labs Reviewed  SARS CORONAVIRUS 2 BY RT PCR    EKG   Radiology No results found.  Procedures Procedures (including critical care time)  Medications Ordered in UC Medications  ibuprofen (ADVIL) tablet 800 mg (800 mg Oral Given 12/24/21 1654)    Initial Impression / Assessment and Plan / UC Course  I have reviewed the triage vital signs and the nursing notes.  Pertinent labs & imaging results that were available during my care of the patient were reviewed by me and considered in my medical decision making (see chart for details).   1. Viral upper respiratory tract infection with cough and nasal congestion Symptoms and physical exam consistent with viral upper respiratory tract infection with cough.  Patient is afebrile in the clinic although has significant chills and body aches.  Ibuprofen 800 mg given in clinic to relieve chills, body aches, and headache.  COVID-19 testing is pending.  If COVID testing is positive, patient is a good candidate for antiviral therapy.  We will notify patient of COVID test results if positive.  Otherwise, we will manage this with prescriptions for supportive care and symptom management.  Guaifenesin 1200 mg twice daily prescribed to thin mucus.  Advised patient to increase water intake while taking this medicine so that it works best in her body.  She may take Tylenol 1000 mg every 6 hours as well as ibuprofen 800 mg every 8 hours as needed with food for fever/chills, sore throat, and headache.  Advised not to take other NSAID containing medications while taking ibuprofen.  Patient given information regarding nonpharmacologic ways to relieve sore throat and after visit summary such as warm tea with honey and salt water gargles.  Deferred imaging based on stable  cardiopulmonary exam and hemodynamically stable vital signs at time of patient encounter.  Advised patient to return to urgent care if symptoms fail to improve in the next 2 to 3 days or if symptoms worsen.  Patient agreeable with this plan.  Discussed physical exam and available lab work findings in clinic with patient.  Counseled patient regarding appropriate use of medications and potential side effects for all medications recommended or prescribed today. Discussed red flag signs and symptoms of worsening condition,when to call the PCP office, return to urgent care, and when to seek higher level of care in the emergency department. Patient verbalizes understanding and agreement with plan. All questions answered. Patient discharged in stable condition.  Final Clinical Impressions(s) / UC Diagnoses   Final diagnoses:  Viral URI with cough  Nasal congestion     Discharge Instructions      You have a viral upper respiratory infection.  COVID-19 testing is pending. We will call you if your result is positive.   Take guaifenesin 1200mg   2 times daily to thin your mucous so that you can cough it up and blow it out of your nose easier. Drink plenty of water while taking this medication so that it works well in your body (at least 8 cups a day).   You may take tylenol 1,000mg  and ibuprofen 800 mg every 8 hours with food as needed for fever/chills, sore throat, aches/pains, and inflammation associated with viral illness. Take this with food to avoid stomach upset.    Do not take naproxen/aleve while taking ibuprofen since it is a similar medication.   Your next dose of tylenol may be when you get  home.  Your next dose of ibuprofen may be at 1am tomorrow.   You may do salt water and baking soda gargles every 4 hours as needed for your throat pain.  Please put 1 teaspoon of salt and 1/2 teaspoon of baking soda in 8 ounces of warm water then gargle and spit the water out. You may also put 1  tablespoon of honey in warm water and drink this to soothe your throat.  Place a humidifier in your room at night to help decrease dry air that can irritate your airway and cause you to have a sore throat and cough.  Please try to eat a well-balanced diet while you are sick so that your body gets proper nutrition to heal.  If you develop any new or worsening symptoms, please return.  If your symptoms are severe, please go to the emergency room.  Follow-up with your primary care provider for further evaluation and management of your symptoms as well as ongoing wellness visits.  I hope you feel better!     ED Prescriptions     Medication Sig Dispense Auth. Provider   Guaifenesin 1200 MG TB12 Take 1 tablet (1,200 mg total) by mouth in the morning and at bedtime. 14 tablet Reita May M, FNP   acetaminophen (TYLENOL) 500 MG tablet Take 2 tablets (1,000 mg total) by mouth every 6 (six) hours as needed. 30 tablet Reita May M, FNP   ibuprofen (ADVIL) 800 MG tablet Take 1 tablet (800 mg total) by mouth 3 (three) times daily. 21 tablet Carlisle Beers, FNP      PDMP not reviewed this encounter.   Carlisle Beers, [place] 12/26/21 [phone]

## 2022-02-20 ENCOUNTER — Ambulatory Visit: Payer: Managed Care, Other (non HMO) | Admitting: Nurse Practitioner

## 2022-04-28 ENCOUNTER — Emergency Department (HOSPITAL_COMMUNITY)
Admission: EM | Admit: 2022-04-28 | Discharge: 2022-04-29 | Disposition: A | Discharge status: Home / Self Care | Payer: Federal, State, Local not specified - Other | Attending: Emergency Medicine | Admitting: Emergency Medicine

## 2022-04-28 ENCOUNTER — Other Ambulatory Visit: Payer: Self-pay

## 2022-04-28 ENCOUNTER — Encounter (HOSPITAL_COMMUNITY): Payer: Self-pay

## 2022-04-28 ENCOUNTER — Ambulatory Visit (HOSPITAL_COMMUNITY)
Admission: AD | Admit: 2022-04-28 | Discharge: 2022-04-28 | Disposition: A | Discharge status: Home / Self Care | Payer: Self-pay | Source: Intra-hospital | Attending: Psychiatry | Admitting: Psychiatry

## 2022-04-28 DIAGNOSIS — Z20822 Contact with and (suspected) exposure to covid-19: Secondary | ICD-10-CM | POA: Insufficient documentation

## 2022-04-28 DIAGNOSIS — R45851 Suicidal ideations: Secondary | ICD-10-CM | POA: Insufficient documentation

## 2022-04-28 DIAGNOSIS — F32A Depression, unspecified: Secondary | ICD-10-CM | POA: Diagnosis present

## 2022-04-28 DIAGNOSIS — Y904 Blood alcohol level of 80-99 mg/100 ml: Secondary | ICD-10-CM | POA: Insufficient documentation

## 2022-04-28 DIAGNOSIS — F29 Unspecified psychosis not due to a substance or known physiological condition: Secondary | ICD-10-CM | POA: Insufficient documentation

## 2022-04-28 DIAGNOSIS — F1010 Alcohol abuse, uncomplicated: Secondary | ICD-10-CM | POA: Insufficient documentation

## 2022-04-28 DIAGNOSIS — F10129 Alcohol abuse with intoxication, unspecified: Secondary | ICD-10-CM | POA: Diagnosis present

## 2022-04-28 DIAGNOSIS — R4182 Altered mental status, unspecified: Secondary | ICD-10-CM | POA: Insufficient documentation

## 2022-04-28 LAB — CBC
HCT: 37.4 % (ref 36.0–46.0)
Hemoglobin: 11.6 g/dL — ABNORMAL LOW (ref 12.0–15.0)
MCH: 25.4 pg — ABNORMAL LOW (ref 26.0–34.0)
MCHC: 31 g/dL (ref 30.0–36.0)
MCV: 82 fL (ref 80.0–100.0)
Platelets: 196 10*3/uL (ref 150–400)
RBC: 4.56 MIL/uL (ref 3.87–5.11)
RDW: 15.9 % — ABNORMAL HIGH (ref 11.5–15.5)
WBC: 3.1 10*3/uL — ABNORMAL LOW (ref 4.0–10.5)
nRBC: 0 % (ref 0.0–0.2)

## 2022-04-28 LAB — COMPREHENSIVE METABOLIC PANEL
ALT: 13 U/L (ref 0–44)
AST: 17 U/L (ref 15–41)
Albumin: 4 g/dL (ref 3.5–5.0)
Alkaline Phosphatase: 26 U/L — ABNORMAL LOW (ref 38–126)
Anion gap: 11 (ref 5–15)
BUN: 10 mg/dL (ref 6–20)
CO2: 20 mmol/L — ABNORMAL LOW (ref 22–32)
Calcium: 8.7 mg/dL — ABNORMAL LOW (ref 8.9–10.3)
Chloride: 107 mmol/L (ref 98–111)
Creatinine, Ser: 0.55 mg/dL (ref 0.44–1.00)
GFR, Estimated: >60 mL/min (ref >60)
Glucose, Bld: 77 mg/dL (ref 70–99)
Potassium: 3.7 mmol/L (ref 3.5–5.1)
Sodium: 138 mmol/L (ref 135–145)
Total Bilirubin: 0.2 mg/dL — ABNORMAL LOW (ref 0.3–1.2)
Total Protein: 8.2 g/dL — ABNORMAL HIGH (ref 6.5–8.1)

## 2022-04-28 LAB — RESP PANEL BY RT-PCR (RSV, FLU A&B, COVID)  RVPGX2
Influenza A by PCR: NEGATIVE
Influenza B by PCR: NEGATIVE
Resp Syncytial Virus by PCR: NEGATIVE
SARS Coronavirus 2 by RT PCR: NEGATIVE

## 2022-04-28 LAB — RAPID URINE DRUG SCREEN, HOSP PERFORMED
Amphetamines: NOT DETECTED
Barbiturates: NOT DETECTED
Benzodiazepines: NOT DETECTED
Cocaine: NOT DETECTED
Opiates: NOT DETECTED
Tetrahydrocannabinol: NOT DETECTED

## 2022-04-28 LAB — ACETAMINOPHEN LEVEL: Acetaminophen (Tylenol), Serum: <10 ug/mL — ABNORMAL LOW (ref 10–30)

## 2022-04-28 LAB — ETHANOL: Alcohol, Ethyl (B): 97 mg/dL — ABNORMAL HIGH (ref <10)

## 2022-04-28 LAB — SALICYLATE LEVEL: Salicylate Lvl: <7 mg/dL — ABNORMAL LOW (ref 7.0–30.0)

## 2022-04-28 NOTE — ED Provider Notes (Signed)
[facility] Provider Note   CSN: [id number] Arrival date & time: 04/28/22  1406     History  Chief Complaint  Patient presents with   Suicidal    Heather Mccullough is a 31 y.o. female.  Patient has history of alcohol abuse.  Patient states she wants to kill herself.  The history is provided by the patient and medical records. No language interpreter was used.  Altered Mental Status Presenting symptoms: behavior changes   Severity:  Severe Most recent episode:  More than 2 days ago Episode history:  Continuous Timing:  Constant Progression:  Worsening Chronicity:  Recurrent Context: alcohol use   Associated symptoms: no abdominal pain, no hallucinations, no headaches, no rash and no seizures        Home Medications Prior to Admission medications   Medication Sig Start Date End Date Taking? Authorizing Provider  acetaminophen (TYLENOL) 500 MG tablet Take 2 tablets (1,000 mg total) by mouth every 6 (six) hours as needed. 12/24/21   Carlisle Beers, FNP  amoxicillin-clavulanate (AUGMENTIN) 875-125 MG tablet Take 1 tablet by mouth every 12 (twelve) hours. Patient not taking: Reported on 11/16/2017 01/29/17   Liberty Handy, PA-C  benzonatate (TESSALON) 100 MG capsule Take 1 capsule (100 mg total) by mouth every 8 (eight) hours. 08/24/18   Hedges, Tinnie Gens, PA-C  cephALEXin (KEFLEX) 500 MG capsule Take 1 capsule (500 mg total) by mouth 2 (two) times daily. 11/16/17   Hedges, Tinnie Gens, PA-C  Guaifenesin 1200 MG TB12 Take 1 tablet (1,200 mg total) by mouth in the morning and at bedtime. 12/24/21   Carlisle Beers, FNP  ibuprofen (ADVIL) 800 MG tablet Take 1 tablet (800 mg total) by mouth 3 (three) times daily. 12/24/21   Carlisle Beers, FNP  metroNIDAZOLE (FLAGYL) 500 MG tablet Take 1 tablet (500 mg total) by mouth 2 (two) times daily. 11/16/17   Eyvonne Mechanic, PA-C      Allergies    Patient has no known allergies.    Review  of Systems   Review of Systems  Constitutional:  Negative for appetite change and fatigue.  HENT:  Negative for congestion, ear discharge and sinus pressure.   Eyes:  Negative for discharge.  Respiratory:  Negative for cough.   Cardiovascular:  Negative for chest pain.  Gastrointestinal:  Negative for abdominal pain and diarrhea.  Genitourinary:  Negative for frequency and hematuria.  Musculoskeletal:  Negative for back pain.  Skin:  Negative for rash.  Neurological:  Negative for seizures and headaches.  Psychiatric/Behavioral:  Positive for dysphoric mood. Negative for hallucinations.     Physical Exam Updated Vital Signs BP 104/70 (BP Location: Left Arm)   Pulse 81   Temp 98.7 F (37.1 C) (Oral)   Resp 12   Ht 5\' 9"  (1.753 m)   LMP 04/28/2022   SpO2 100%   BMI 22.15 kg/m  Physical Exam Vitals and nursing note reviewed.  Constitutional:      Appearance: She is well-developed.  HENT:     Head: Normocephalic.     Nose: Nose normal.  Eyes:     General: No scleral icterus.    Conjunctiva/sclera: Conjunctivae normal.  Neck:     Thyroid: No thyromegaly.  Cardiovascular:     Rate and Rhythm: Normal rate and regular rhythm.     Heart sounds: No murmur heard.    No friction rub. No gallop.  Pulmonary:     Breath sounds: No stridor. No wheezing or  rales.  Chest:     Chest wall: No tenderness.  Abdominal:     General: There is no distension.     Tenderness: There is no abdominal tenderness. There is no rebound.  Musculoskeletal:        General: Normal range of motion.     Cervical back: Neck supple.  Lymphadenopathy:     Cervical: No cervical adenopathy.  Skin:    Findings: No erythema or rash.  Neurological:     Mental Status: She is alert and oriented to person, place, and time.     Motor: No abnormal muscle tone.     Coordination: Coordination normal.  Psychiatric:     Comments: Suicidal     ED Results / Procedures / Treatments   Labs (all labs ordered  are listed, but only abnormal results are displayed) Labs Reviewed  COMPREHENSIVE METABOLIC PANEL - Abnormal; Notable for the following components:      Result Value   CO2 20 (*)    Calcium 8.7 (*)    Total Protein 8.2 (*)    Alkaline Phosphatase 26 (*)    Total Bilirubin 0.2 (*)    All other components within normal limits  ETHANOL - Abnormal; Notable for the following components:   Alcohol, Ethyl (B) 97 (*)    All other components within normal limits  SALICYLATE LEVEL - Abnormal; Notable for the following components:   Salicylate Lvl <7.0 (*)    All other components within normal limits  ACETAMINOPHEN LEVEL - Abnormal; Notable for the following components:   Acetaminophen (Tylenol), Serum <10 (*)    All other components within normal limits  CBC - Abnormal; Notable for the following components:   WBC 3.1 (*)    Hemoglobin 11.6 (*)    MCH 25.4 (*)    RDW 15.9 (*)    All other components within normal limits  RESP PANEL BY RT-PCR (RSV, FLU A&B, COVID)  RVPGX2  RAPID URINE DRUG SCREEN, HOSP PERFORMED  I-STAT BETA HCG BLOOD, ED ([facility], [facility], [facility] ONLY)    EKG None  Radiology No results found.  Procedures Procedures    Medications Ordered in ED Medications - No data to display  ED Course/ Medical Decision Making/ A&P Patient is medically cleared and awaiting behavioral health evaluation patient                          Medical Decision Making This patient presents to the ED for concern of suicidal ideation, this involves an extensive number of treatment options, and is a complaint that carries with it a high risk of complications and morbidity.  The differential diagnosis includes suicidal and depression   Co morbidities that complicate the patient evaluation  EtOH abuse   Additional history obtained:  Additional history obtained from patient External records from outside source obtained and reviewed including hospital records   Lab Tests:  I Ordered, and  personally interpreted labs.  The pertinent results include: EtOH 97   Imaging Studies ordered:  No imaging Cardiac Monitoring: / EKG:  The patient was maintained on a cardiac monitor.  I personally viewed and interpreted the cardiac monitored which showed an underlying rhythm of: Sinus rhythm   Consultations Obtained:  I requested consultation with the psychiatry,  and discussed lab and imaging findings as well as pertinent plan - they recommend: Observation   Problem List / ED Course / Critical interventions / Medication management  Depression and EtOH abuse  No medicines given Reevaluation of the patient after these medicines showed that the patient stayed the same I have reviewed the patients home medicines and have made adjustments as needed   Social Determinants of Health:  EtOH abuse   Test / Admission - Considered:  Behavioral health evaluated and observed patient and discharged her home  Patient with EtOH abuse and suicidal ideation.   Behavioral health consult pending       Final Clinical Impression(s) / ED Diagnoses Final diagnoses:  None    Rx / DC Orders ED Discharge Orders     None         Bethann Berkshire, MD 05/08/22 1450

## 2022-04-28 NOTE — H&P (Signed)
Behavioral Health Medical Screening Exam  Heather Mccullough is a 31 y.o. African-American female who presents voluntarily as a walk-in accompanied by a Geneticist, [employer] for suicidal attempt of drinking 2 bottles of wine today.  Patient has psychiatric history of depression, mood disorder, and unspecified mood affective disorder.  Patient is incoherent and unable to communicate.  Information provided by the church staff.  Church staff reports that patient came to a church meeting today verbalizing that she wants to end her life and has consumed 2 bottles of wine already.  Patient is very intoxicated and incoherent for any history taking.  Dr. Rosalia Hammers at [facility] emergency room called and report provided.  Safe transport called to transfer patient to [facility] emergency room for medical clearance.  Patient departed [facility] without any incidents.  Total Time spent with patient: 20 minutes  Psychiatric Specialty Exam:  Presentation  General Appearance:  Bizarre  Eye Contact: Minimal; Poor  Speech: Garbled  Speech Volume: -- (Patient moaning)  Handedness: Right  Mood and Affect  Mood: -- (Unable to assess due to intoxication)  Affect: -- (Unable to assess due to intoxication)  Thought Process  Thought Processes: Disorganized (Unable to assess due to intoxication)  Descriptions of Associations:-- (Unable to assess due to intoxication)  Orientation:Partial (Name only)  Thought Content:Illogical  History of Schizophrenia/Schizoaffective disorder:No data recorded Duration of Psychotic Symptoms:No data recorded Hallucinations:Hallucinations: -- (Unable to assess due to intoxication)  Ideas of Reference:-- (Unable to assess due to intoxication)  Suicidal Thoughts:Suicidal Thoughts: Yes, Active (At church, told staff she wanted to end her life.  Drank 2 bottles of wine today) SI Active Intent and/or Plan: -- (incoherent)  Homicidal Thoughts:Homicidal Thoughts: --  Corporate treasurer)  Sensorium  Memory: -- Corporate treasurer)  Judgment: -- Corporate treasurer)  Insight: -- Corporate treasurer)  Executive Functions  Concentration: -- Corporate treasurer)  Attention Span: -- Corporate treasurer)  Recall: -- Corporate treasurer)  Fund of Knowledge: -- Corporate treasurer)  Language: -- Corporate treasurer)  Psychomotor Activity  Psychomotor Activity: Psychomotor Activity: Restlessness; Increased  Assets  Assets: -- (Incoherent)  Sleep  Sleep: Sleep: Poor Number of Hours of Sleep: 0 (Incoherent, unable to obtain)  Physical Exam: Physical Exam Vitals and nursing note reviewed.  HENT:     Head: Normocephalic.     Right Ear: External ear normal.     Left Ear: External ear normal.     Nose: Nose normal.     Mouth/Throat:     Mouth: Mucous membranes are moist.     Pharynx: Oropharynx is clear.  Eyes:     Comments: Unable to assess due to intoxication  Cardiovascular:     Rate and Rhythm: Normal rate.     Pulses: Normal pulses.  Pulmonary:     Effort: Pulmonary effort is normal.  Abdominal:     Palpations: Abdomen is soft.  Genitourinary:    Comments: Deferred Musculoskeletal:        General: Normal range of motion.     Cervical back: Normal range of motion.  Skin:    General: Skin is warm.  Neurological:     Mental Status: She is alert.     Comments: Unable to assess due to intoxication  Psychiatric:     Comments: Unable to assess due to intoxication    Review of Systems  Constitutional: Negative.  Negative for chills and fever.  HENT: Negative.         Opens eyes when name called  Eyes: Negative.  Open eyes when name called  Respiratory: Negative.    Cardiovascular: Negative.   Gastrointestinal:        Intoxicated  Genitourinary: Negative.   Musculoskeletal: Negative.   Skin: Negative.   Neurological:        Intoxicated  Endo/Heme/Allergies: Negative.   Psychiatric/Behavioral:         Intoxicated   There were no vitals taken for this visit. There is no  height or weight on file to calculate BMI.T 98.6, P 81, RR18, BP 111/72.  Musculoskeletal: Strength & Muscle Tone: within normal limits Gait & Station: normal Patient leans: N/A  [place] Scale:  Flowsheet Row OP [facility] from 12/24/2021 in [facility] ED from 09/19/2020 in [facility]  C-SSRS RISK CATEGORY Moderate Risk No Risk Low Risk       Recommendations:  Based on my evaluation the patient appears to have an emergency medical condition for which I recommend the patient be transferred to the emergency department for further evaluation.  Cecilie Lowers, FNP 04/28/2022, 1:28 PM

## 2022-04-28 NOTE — ED Notes (Signed)
2 bags of personal belongings at triage desk.

## 2022-04-28 NOTE — ED Triage Notes (Signed)
Patient reports that she is  suicidal. Patient also reports that she drank 2 bottles of wine today.  Patient states she is having a "painful period"  Patient went to [facility] earlier and was sent to the Ed to be medically cleared.

## 2022-04-28 NOTE — ED Provider Triage Note (Signed)
Emergency Medicine Provider Triage Evaluation Note  Heather Mccullough , a 31 y.o. female  was evaluated in triage.  Patient was sent to ED by [facility] for medical clearance after drinking 2 bottles of wine as a suicide attempt today.  Patient reports she does have a history of depression and uses alcohol as a form of hurting herself.  Denies drug use or other ingestion.  Reports abdominal pain due to her period which has just began today.  Denies HI, shortness of breath, chest pain, syncope.  She is accompanied by a member of her church.  Review of Systems  Positive: As above Negative: As above  Physical Exam  BP 104/70 (BP Location: Left Arm)   Pulse 81   Temp 98.7 F (37.1 C) (Oral)   Resp 12   Ht 5\' 9"  (1.753 m)   LMP 04/28/2022   SpO2 100%   BMI 22.15 kg/m  Gen:   Awake, no distress, A&Ox4 Resp:  Normal effort MSK:   Moves extremities without difficulty  Other:    Medical Decision Making  Medically screening exam initiated at 4:16 PM.  Appropriate orders placed.  Vietta Bofola Bolamba was informed that the remainder of the evaluation will be completed by another provider, this initial triage assessment does not replace that evaluation, and the importance of remaining in the ED until their evaluation is complete.     04/30/2022 R, Melton Alar 04/28/22 [phone]

## 2022-04-29 DIAGNOSIS — F10129 Alcohol abuse with intoxication, unspecified: Secondary | ICD-10-CM | POA: Diagnosis not present

## 2022-04-29 NOTE — ED Provider Notes (Signed)
Emergency Medicine Observation Re-evaluation Note  Heather Mccullough is a 31 y.o. female, seen on rounds today.  Pt initially presented to the ED for complaints of Suicidal Currently, the patient is resting.  Physical Exam  BP 104/70 (BP Location: Left Arm)   Pulse 81   Temp 98.7 F (37.1 C) (Oral)   Resp 12   Ht 5\' 9"  (1.753 m)   LMP 04/28/2022   SpO2 100%   BMI 22.15 kg/m  Physical Exam General: NAD Cardiac: well perfused Lungs: even and unlabored Psych: no agitation  ED Course / MDM  EKG:   I have reviewed the labs performed to date as well as medications administered while in observation.  Recent changes in the last 24 hours include none, pt presented after drinking two bottles of wine as a suicide attempt. TTS consult pending.  Plan  Current plan is for TTS consult.    04/30/2022, MD 04/29/22 1039

## 2022-04-29 NOTE — Progress Notes (Signed)
Julieanne Cotton, NP, requested that [facility] add SA resources. SA resources added. No further needs att. TOC signing off.

## 2022-04-29 NOTE — BH Assessment (Signed)
TTS attempted to complete assessment. TTS clinician contacted [facility] as this patient does not have an assigned RN. Per Porfirio Mylar, receptionist, no RN assigned, please assess at later time, will try to inquire about an assigned RN for this patient.

## 2022-04-29 NOTE — ED Notes (Signed)
Late entry:  At discharge, Pt's belongings were returned to the patient.

## 2022-04-29 NOTE — Discharge Summary (Signed)
[facility] Psych ED Discharge  04/29/2022 1:21 PM Heather Mccullough  MRN:  [id number]  Principal Problem: Alcohol abuse with intoxication [facility]) Discharge Diagnoses: Principal Problem:   Alcohol abuse with intoxication ([place]) Active Problems:   Depression  Clinical Impression:  Final diagnoses:  None   Subjective: AA Female, 31 years old was brought to the ER from [facility] where she walked in seeking help with suicide ideation/attempt by drinking two bottles of Wine.  She was medically cleared and sent to ER.Marland Kitchen  Patient has Psychiatric hx of Depression and Mood affective disorder.  Patient was seen this morning awake, alert and oriented x 5.  She does not remember all that happened yesterday but remembers drinking two bottles of wine due to her addiction with Alcohol.  She denied using alcohol to kill herself.  She admitted to been drinking heavily and after drinking she looses control of herself and environment.    She started drinking 2020 and gradually has become an alcoholic.  She also feels depressed and ashamed because she dropped out of school and Alcohol is involved.  She is depressed and anxious due to family pressure of getting married and having children.  She works part time although she is not in school.  Her mother moved to [facility] but  she stayed on here and has a roommate who does not drink.  Patient denies ever been prescribed Medication for depression or seen by Psychiatrist.  She reports crying spells over little things, poor sleep and appetite.  Alcohol level was 97 on arrival to the ER and UDS is negative. This morning patient denies SI/HI/AVH and no mention of paranoia.  She is interested in outpatient Alcohol treatment and we discussed starting with AA meetings and SIOP treatment.  She is interested in [employer] for outpatient Mental health treatment. Patient is calm and cooperative and is willing to seek treatment for Alcoholism and Depression.  Resources are provided for her  need.  We discussed safety plan-call 911 or 988 for mental health crisis including but not limited to Suicide or Homicidal ideation.  Patient was advised to go to [facility] facility as walk in for mental health emergency.  Discharge plan discussed with DR Leverne Humbles, Psychiatrist who is in agreement.  Patient is Psychiatrically cleared.  ED Assessment Time Calculation: Start Time: [id number] Stop Time: 1310 Total Time in Minutes (Assessment Completion): 21   Past Psychiatric History: Alcohol abuse/Dependence, Mood Disorder, Depression  Past Medical History:  Past Medical History:  Diagnosis Date   Depression    No past surgical history on file. Family History: No family history on file. Family Psychiatric  History: Mother-Depression Social History:  Social History   Substance and Sexual Activity  Alcohol Use Yes     Social History   Substance and Sexual Activity  Drug Use No    Social History   Socioeconomic History   Marital status: Single    Spouse name: Not on file   Number of children: Not on file   Years of education: Not on file   Highest education level: Not on file  Occupational History   Not on file  Tobacco Use   Smoking status: Never   Smokeless tobacco: Never  Vaping Use   Vaping Use: Never used  Substance and Sexual Activity   Alcohol use: Yes   Drug use: No   Sexual activity: Not on file  Other Topics Concern   Not on file  Social History Narrative   Not on  file   Social Determinants of Health   Financial Resource Strain: Not on file  Food Insecurity: Not on file  Transportation Needs: Not on file  Physical Activity: Not on file  Stress: Not on file  Social Connections: Not on file    Tobacco Cessation:  N/A, patient does not currently use tobacco products  Current Medications: No current facility-administered medications for this encounter.   Current Outpatient Medications  Medication Sig Dispense Refill   acetaminophen (TYLENOL) 500 MG  tablet Take 2 tablets (1,000 mg total) by mouth every 6 (six) hours as needed. 30 tablet 0   amoxicillin-clavulanate (AUGMENTIN) 875-125 MG tablet Take 1 tablet by mouth every 12 (twelve) hours. (Patient not taking: Reported on 11/16/2017) 14 tablet 0   benzonatate (TESSALON) 100 MG capsule Take 1 capsule (100 mg total) by mouth every 8 (eight) hours. 21 capsule 0   cephALEXin (KEFLEX) 500 MG capsule Take 1 capsule (500 mg total) by mouth 2 (two) times daily. 10 capsule 0   Guaifenesin 1200 MG TB12 Take 1 tablet (1,200 mg total) by mouth in the morning and at bedtime. 14 tablet 0   ibuprofen (ADVIL) 800 MG tablet Take 1 tablet (800 mg total) by mouth 3 (three) times daily. 21 tablet 0   metroNIDAZOLE (FLAGYL) 500 MG tablet Take 1 tablet (500 mg total) by mouth 2 (two) times daily. 14 tablet 0   PTA Medications: (Not in a hospital admission)   [place]:  [place] ED from 04/28/2022 in [place] DEPT Most recent reading at 04/28/2022  4:16 PM OP Visit from 04/28/2022 in [place] Most recent reading at 04/28/2022  1:25 PM ED from 12/24/2021 in [facility] [place] Most recent reading at 12/24/2021  4:24 PM  C-SSRS RISK CATEGORY High Risk Moderate Risk No Risk       Musculoskeletal: Strength & Muscle Tone: within normal limits Gait & Station: normal Patient leans: Front  Psychiatric Specialty Exam: Presentation  General Appearance:  Well Groomed  Eye Contact: Good  Speech: Clear and Coherent; Normal Rate  Speech Volume: Normal  Handedness: Right   Mood and Affect  Mood: Anxious; Depressed  Affect: Congruent; Tearful   Thought Process  Thought Processes: Coherent; Goal Directed; Linear  Descriptions of Associations:Intact  Orientation:Full (Time, Place and Person)  Thought Content:Logical  History of Schizophrenia/Schizoaffective disorder:No data recorded Duration of  Psychotic Symptoms:No data recorded Hallucinations:Hallucinations: None  Ideas of Reference:None  Suicidal Thoughts:Suicidal Thoughts: No SI Active Intent and/or Plan: -- (incoherent)  Homicidal Thoughts:Homicidal Thoughts: No   Sensorium  Memory: Immediate Good; Recent Good; Remote Good  Judgment: Intact  Insight: Present   Executive Functions  Concentration: Good  Attention Span: Good  Recall: Good  Fund of Knowledge: Good  Language: Good   Psychomotor Activity  Psychomotor Activity: Psychomotor Activity: Normal   Assets  Assets: Communication Skills; Desire for Improvement; Housing; Physical Health; Resilience   Sleep  Sleep: Sleep: Fair Number of Hours of Sleep: 0 (Incoherent, unable to obtain)    Physical Exam: Physical Exam Vitals and nursing note reviewed.  Constitutional:      Appearance: Normal appearance.  HENT:     Head: Normocephalic and atraumatic.     Nose: Nose normal.  Cardiovascular:     Rate and Rhythm: Normal rate and regular rhythm.  Pulmonary:     Effort: Pulmonary effort is normal.  Musculoskeletal:        General: Normal range of motion.  Cervical back: Normal range of motion.  Skin:    General: Skin is warm and dry.  Neurological:     Mental Status: She is alert and oriented to person, place, and time.    Review of Systems  Constitutional: Negative.   HENT: Negative.    Eyes: Negative.   Respiratory: Negative.    Cardiovascular: Negative.   Gastrointestinal: Negative.   Genitourinary: Negative.   Musculoskeletal: Negative.   Skin: Negative.   Neurological: Negative.   Endo/Heme/Allergies: Negative.   Psychiatric/Behavioral:  Positive for depression. The patient is nervous/anxious and has insomnia.    Blood pressure 104/70, pulse 81, temperature 98.7 F (37.1 C), temperature source Oral, resp. rate 12, height 5\' 9"  (1.753 m), last menstrual period 04/28/2022, SpO2 100 %. Body mass index is 22.15  kg/m.   Demographic Factors:  Adolescent or young adult and Low socioeconomic status  Loss Factors: NA  Historical Factors: Family history of mental illness or substance abuse  Risk Reduction Factors:   Religious beliefs about death, Employed, and Living with another person, especially a relative  Continued Clinical Symptoms:  Depression:   Comorbid alcohol abuse/dependence Alcohol/Substance Abuse/Dependencies  Cognitive Features That Contribute To Risk:  None    Suicide Risk:  Minimal: No identifiable suicidal ideation.  Patients presenting with no risk factors but with morbid ruminations; may be classified as minimal risk based on the severity of the depressive symptoms   Follow-up Information     [facility] Follow up.   Specialty: Urgent Care Contact information: [phone] [place] [phone]                Plan Of Care/Follow-up recommendations:  Activity:  as tolerated Diet:  Regular  Medical Decision Making: Patient is alert and oriented x 5 and fully engaged in the assessment.  She denies suicide attempt or feeling suicidal this morning.  She admits that once she drinks she looses control of herself and environment.  She believes this is the time to stop drinking Alcohol.  Resources are provided for outpatient and inpatient substance abuse treatment.  She is also advised to seek outpatient treatment for Depression.  Patient is Psychiatrically cleared.  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  [phone], NP-PMHNP-BC 04/29/2022, 1:21 PM

## 2022-06-28 ENCOUNTER — Emergency Department (HOSPITAL_COMMUNITY): Payer: Self-pay

## 2022-06-28 ENCOUNTER — Emergency Department (HOSPITAL_COMMUNITY)
Admission: EM | Admit: 2022-06-28 | Discharge: 2022-06-28 | Disposition: A | Discharge status: Home / Self Care | Payer: Self-pay | Attending: Emergency Medicine | Admitting: Emergency Medicine

## 2022-06-28 DIAGNOSIS — E871 Hypo-osmolality and hyponatremia: Secondary | ICD-10-CM | POA: Insufficient documentation

## 2022-06-28 DIAGNOSIS — R1030 Lower abdominal pain, unspecified: Secondary | ICD-10-CM | POA: Insufficient documentation

## 2022-06-28 LAB — CBC
HCT: 40.1 % (ref 36.0–46.0)
Hemoglobin: 12.4 g/dL (ref 12.0–15.0)
MCH: 25.6 pg — ABNORMAL LOW (ref 26.0–34.0)
MCHC: 30.9 g/dL (ref 30.0–36.0)
MCV: 82.9 fL (ref 80.0–100.0)
Platelets: 190 10*3/uL (ref 150–400)
RBC: 4.84 MIL/uL (ref 3.87–5.11)
RDW: 15.5 % (ref 11.5–15.5)
WBC: 4.6 10*3/uL (ref 4.0–10.5)
nRBC: 0 % (ref 0.0–0.2)

## 2022-06-28 LAB — COMPREHENSIVE METABOLIC PANEL
ALT: 17 U/L (ref 0–44)
AST: 25 U/L (ref 15–41)
Albumin: 4 g/dL (ref 3.5–5.0)
Alkaline Phosphatase: 30 U/L — ABNORMAL LOW (ref 38–126)
Anion gap: 9 (ref 5–15)
BUN: 11 mg/dL (ref 6–20)
CO2: 19 mmol/L — ABNORMAL LOW (ref 22–32)
Calcium: 9 mg/dL (ref 8.9–10.3)
Chloride: 104 mmol/L (ref 98–111)
Creatinine, Ser: 0.58 mg/dL (ref 0.44–1.00)
GFR, Estimated: >60 mL/min (ref >60)
Glucose, Bld: 97 mg/dL (ref 70–99)
Potassium: 3.6 mmol/L (ref 3.5–5.1)
Sodium: 132 mmol/L — ABNORMAL LOW (ref 135–145)
Total Bilirubin: 0.2 mg/dL — ABNORMAL LOW (ref 0.3–1.2)
Total Protein: 7.7 g/dL (ref 6.5–8.1)

## 2022-06-28 LAB — URINALYSIS, MICROSCOPIC (REFLEX): RBC / HPF: NONE SEEN RBC/hpf (ref 0–5)

## 2022-06-28 LAB — LIPASE, BLOOD: Lipase: 38 U/L (ref 11–51)

## 2022-06-28 LAB — URINALYSIS, ROUTINE W REFLEX MICROSCOPIC
Bilirubin Urine: NEGATIVE
Glucose, UA: NEGATIVE mg/dL
Ketones, ur: NEGATIVE mg/dL
Nitrite: NEGATIVE
Protein, ur: NEGATIVE mg/dL
Specific Gravity, Urine: 1.01 (ref 1.005–1.030)
pH: 6.5 (ref 5.0–8.0)

## 2022-06-28 LAB — I-STAT BETA HCG BLOOD, ED (MC, WL, AP ONLY): I-stat hCG, quantitative: 886.7 m[IU]/mL — ABNORMAL HIGH (ref <5)

## 2022-06-28 LAB — HCG, QUANTITATIVE, PREGNANCY: hCG, Beta Chain, Quant, S: 1245 m[IU]/mL — ABNORMAL HIGH (ref <5)

## 2022-06-28 NOTE — ED Provider Notes (Signed)
[name] Note   CSN: [id number] Arrival date & time: 06/28/22  1319     History  Chief Complaint  Patient presents with   Abdominal Pain    Heather Mccullough is a 32 y.o. female with medical history of depression, alcohol use.  Patient presents to the ED for evaluation of lower abdominal pain.  Patient reports last night around bedtime, 11 PM, she developed lower abdominal pain that is nonradiating.  Patient states that she woke up this morning and the abdominal pain had transitioned slightly down more towards her vagina.  Patient denies any fevers, nausea, vomiting, diarrhea, vaginal discharge.  Patient reports that she is having to pee more often and this has been going on for the last 2 days however she denies any overt dysuria.  Patient states she is sexually active however denies concern for pregnancy.  Patient is unsure of her last menstrual period, reports it was sometime in the last month, "maybe 2".  On chart view, patient has history of alcohol use.  Patient reports that she is not drinking alcohol in quite some time, she states a week.   Abdominal Pain Associated symptoms: no diarrhea, no dysuria, no fever, no nausea, no vaginal discharge and no vomiting        Home Medications Prior to Admission medications   Medication Sig Start Date End Date Taking? Authorizing Provider  acetaminophen (TYLENOL) 500 MG tablet Take 2 tablets (1,000 mg total) by mouth every 6 (six) hours as needed. 12/24/21   Talbot Grumbling, FNP  amoxicillin-clavulanate (AUGMENTIN) 875-125 MG tablet Take 1 tablet by mouth every 12 (twelve) hours. Patient not taking: Reported on 11/16/2017 01/29/17   Kinnie Feil, PA-C  benzonatate (TESSALON) 100 MG capsule Take 1 capsule (100 mg total) by mouth every 8 (eight) hours. 08/24/18   Hedges, Dellis Filbert, PA-C  cephALEXin (KEFLEX) 500 MG capsule Take 1 capsule (500 mg total) by mouth 2 (two) times  daily. 11/16/17   Hedges, Dellis Filbert, PA-C  Guaifenesin 1200 MG TB12 Take 1 tablet (1,200 mg total) by mouth in the morning and at bedtime. 12/24/21   Talbot Grumbling, FNP  ibuprofen (ADVIL) 800 MG tablet Take 1 tablet (800 mg total) by mouth 3 (three) times daily. 12/24/21   Talbot Grumbling, FNP  metroNIDAZOLE (FLAGYL) 500 MG tablet Take 1 tablet (500 mg total) by mouth 2 (two) times daily. 11/16/17   Okey Regal, PA-C      Allergies    Patient has no known allergies.    Review of Systems   Review of Systems  Constitutional:  Negative for fever.  Gastrointestinal:  Positive for abdominal pain. Negative for diarrhea, nausea and vomiting.  Genitourinary:  Positive for frequency. Negative for dysuria and vaginal discharge.  All other systems reviewed and are negative.   Physical Exam Updated Vital Signs BP 109/72   Pulse 81   Temp 98.7 F (37.1 C) (Oral)   Resp 16   SpO2 100%  Physical Exam  ED Results / Procedures / Treatments   Labs (all labs ordered are listed, but only abnormal results are displayed) Labs Reviewed  COMPREHENSIVE METABOLIC PANEL - Abnormal; Notable for the following components:      Result Value   Sodium 132 (*)    CO2 19 (*)    Alkaline Phosphatase 30 (*)    Total Bilirubin 0.2 (*)    All other components within normal limits  CBC - Abnormal; Notable  for the following components:   MCH 25.6 (*)    All other components within normal limits  URINALYSIS, ROUTINE W REFLEX MICROSCOPIC - Abnormal; Notable for the following components:   Hgb urine dipstick TRACE (*)    Leukocytes,Ua SMALL (*)    All other components within normal limits  URINALYSIS, MICROSCOPIC (REFLEX) - Abnormal; Notable for the following components:   Bacteria, UA RARE (*)    All other components within normal limits  I-STAT BETA HCG BLOOD, ED ([facility], [facility], [facility] ONLY) - Abnormal; Notable for the following components:   I-stat hCG, quantitative 886.7 (*)    All other components within  normal limits  LIPASE, BLOOD  HCG, QUANTITATIVE, PREGNANCY    EKG None  Radiology No results found.  Procedures Procedures   Medications Ordered in ED Medications - No data to display  ED Course/ Medical Decision Making/ A&P  Medical Decision Making Amount and/or Complexity of Data Reviewed Labs: ordered.   60-year-old female presents to ED for evaluation.  Please see HPI for further details.  On examination the patient is afebrile and nontachycardic.  Lung sounds clear bilaterally, she is not hypoxic.  Abdomen is soft and compressible throughout.  No tenderness elicited on examination.  Neuroexam at baseline.  Patient workup will include CBC, CMP, lipase, urinalysis, i-STAT beta-hCG.  CBC unremarkable with no leukocytosis or anemia.  Patient CMP with decreased sodium to 132.  Urinalysis unremarkable, trace hemoglobin and leukocytes.  Patient lipase within normal limits.  Patient i-STAT beta-hCG elevated at 886.7.  Due to patient abdominal pain we will proceed with transvaginal ultrasound to rule out ectopic.  At end of shift, patient workup not complete.  Patient signed out to oncoming provider Alyse Low, PA-C.  Final Clinical Impression(s) / ED Diagnoses Final diagnoses:  Lower abdominal pain    Rx / DC Orders ED Discharge Orders     None         Lawana Chambers 06/28/22 1523    Noemi Chapel, MD 06/29/22 [phone]

## 2022-06-28 NOTE — ED Provider Notes (Signed)
Pt's care assumed at 3:#0 pm.  Ultrasound and qhcg pending.  Pt early pregnant with lower abdominal pain.   Qhcg 1245 Ultrasound  no iup.   I spoke with MAu NP who will schedule pt for follow up hcg.   Pt counseled on ectopic risk.    Heather Mccullough, [place] 06/28/22 1834    Lonell Grandchild, MD 06/29/22 [phone]

## 2022-06-28 NOTE — ED Triage Notes (Signed)
Patient here with complaint of central lower abdominal pain that started last night and persists today. Patient denies nausea, vomiting ,and diarrhea. Patient is alert, oriented, ambulating independently with steady gait and is in no apparent distress at this time.

## 2022-06-30 ENCOUNTER — Other Ambulatory Visit (HOSPITAL_COMMUNITY): Payer: Self-pay

## 2022-06-30 ENCOUNTER — Encounter (HOSPITAL_COMMUNITY): Payer: Self-pay | Admitting: Family Medicine

## 2022-06-30 ENCOUNTER — Inpatient Hospital Stay (HOSPITAL_COMMUNITY)
Admission: AD | Admit: 2022-06-30 | Discharge: 2022-06-30 | Disposition: A | Discharge status: Home / Self Care | Payer: Self-pay | Attending: Family Medicine | Admitting: Family Medicine

## 2022-06-30 DIAGNOSIS — O3680X0 Pregnancy with inconclusive fetal viability, not applicable or unspecified: Secondary | ICD-10-CM | POA: Insufficient documentation

## 2022-06-30 DIAGNOSIS — Z3A09 9 weeks gestation of pregnancy: Secondary | ICD-10-CM | POA: Insufficient documentation

## 2022-06-30 DIAGNOSIS — Z3A00 Weeks of gestation of pregnancy not specified: Secondary | ICD-10-CM

## 2022-06-30 LAB — HCG, QUANTITATIVE, PREGNANCY: hCG, Beta Chain, Quant, S: 3773 m[IU]/mL — ABNORMAL HIGH (ref <5)

## 2022-06-30 NOTE — MAU Note (Signed)
Lavelle Bofola Jerilynn Mages is a 32 y.o. at Unknown here in MAU reporting: here for repeat blood work. No bleeding.  Still having pain that feels like period is going to come, not all the time  Onset of complaint: ongoing Pain score: mild cramping Vitals:   06/30/22 1626  BP: 117/67  Pulse: 71  Resp: 16  Temp: 98 F (36.7 C)  SpO2: 100%     Lab orders placed from triage:    Blood work drawn

## 2022-06-30 NOTE — Discharge Instructions (Signed)
Please return to MAU with sudden onset of lower pelvic pain or heavy vaginal bleeding.

## 2022-06-30 NOTE — MAU Provider Note (Signed)
   S Heather Mccullough is a 32 y.o. G1P0 patient who presents to MAU today with complaint of repeat hormone level. She occasionally feels like she's going to start her period. No bleeding or pelvic pain.   O BP 117/67 (BP Location: Right Arm)   Pulse 71   Temp 98 F (36.7 C)   Resp 16   Ht 5' 9"$  (1.753 m)   Wt 68.6 kg   LMP 04/28/2022   SpO2 100%   BMI 22.33 kg/m  Physical Exam Vitals reviewed.  Constitutional:      Appearance: Normal appearance.  Pulmonary:     Effort: Pulmonary effort is normal.  Abdominal:     General: Abdomen is flat.     Palpations: Abdomen is soft.  Skin:    General: Skin is warm and dry.     Capillary Refill: Capillary refill takes less than 2 seconds.  Neurological:     General: No focal deficit present.     Mental Status: She is alert.  Psychiatric:        Mood and Affect: Mood normal.        Behavior: Behavior normal.        Thought Content: Thought content normal.        Judgment: Judgment normal.    Results for orders placed or performed during the hospital encounter of 06/30/22 (from the past 24 hour(s))  hCG, quantitative, pregnancy     Status: Abnormal   Collection Time: 06/30/22  4:22 PM  Result Value Ref Range   hCG, Beta Chain, Quant, S 3,773 (H) <5 mIU/mL     A Medical screening exam complete 1. Pregnancy of unknown anatomic location      P Discharge from MAU in stable condition Warning signs for worsening condition that would warrant emergency follow-up discussed Rpt [place] in 7 days. Patient may return to MAU as needed   Truett Mainland, DO 06/30/2022 5:59 PM

## 2022-07-02 ENCOUNTER — Other Ambulatory Visit: Payer: Self-pay

## 2022-07-10 ENCOUNTER — Encounter: Payer: Self-pay | Admitting: Family Medicine

## 2022-07-10 ENCOUNTER — Ambulatory Visit (INDEPENDENT_AMBULATORY_CARE_PROVIDER_SITE_OTHER): Payer: Self-pay | Admitting: *Deleted

## 2022-07-10 ENCOUNTER — Ambulatory Visit
Admission: RE | Admit: 2022-07-10 | Discharge: 2022-07-10 | Disposition: A | Discharge status: Home / Self Care | Payer: Medicaid Other | Source: Ambulatory Visit | Attending: Family Medicine | Admitting: Family Medicine

## 2022-07-10 VITALS — BP 126/89 | HR 77

## 2022-07-10 DIAGNOSIS — O3680X0 Pregnancy with inconclusive fetal viability, not applicable or unspecified: Secondary | ICD-10-CM

## 2022-07-10 DIAGNOSIS — Z3A01 Less than 8 weeks gestation of pregnancy: Secondary | ICD-10-CM

## 2022-07-10 NOTE — Progress Notes (Signed)
Here for [place] results. Reviewed with Dr. Clement Husbands who advised live pregnancy at 36w6dwith EDD10/25/24 with small subchorionic hemorrhage. Advised no strenuous activity and no sex for 4 weeks. Stay hydrated. Bleeding precautions given. Advised to start prenatal care with provider she chooses, list placed in AVS. Advised to take PNV. Patient voices understanding.  LStaci Acosta

## 2022-07-10 NOTE — Patient Instructions (Signed)
Prenatal Care Providers           [facility]  [address] [phone]  [facility]   [address]  [phone]  [facility] [place] [facility]       [address] [phone]            [facility] [place]     [address] [phone]          [facility] [place]   [address] [phone]  [facility]  [address] [phone]     [facility])  [address]   [phone]     [facility]  Phone: [phone]  [facility]  Phone: [phone]  [facility] Phone: [phone]  [facility] Phone: [phone]  [facility] Phone: [phone]  [facility] Phone: [phone]  [facility]  Phone: [phone]  

## 2022-08-20 ENCOUNTER — Ambulatory Visit (INDEPENDENT_AMBULATORY_CARE_PROVIDER_SITE_OTHER): Payer: Medicaid Other

## 2022-08-20 DIAGNOSIS — Z3401 Encounter for supervision of normal first pregnancy, first trimester: Secondary | ICD-10-CM

## 2022-08-20 DIAGNOSIS — Z3402 Encounter for supervision of normal first pregnancy, second trimester: Secondary | ICD-10-CM | POA: Insufficient documentation

## 2022-08-20 MED ORDER — PRENATE PIXIE 10-0.6-0.4-200 MG PO CAPS: 1.0000 | ORAL_CAPSULE | Freq: Every day | ORAL | 11 refills | Status: AC

## 2022-08-20 MED ORDER — DICLEGIS 10-10 MG PO TBEC: 2.0000 | DELAYED_RELEASE_TABLET | Freq: Every day | ORAL | 5 refills | Status: AC

## 2022-08-20 MED ORDER — GOJJI WEIGHT SCALE MISC: 1.0000 | 0 refills | Status: AC

## 2022-08-20 MED ORDER — BLOOD PRESSURE KIT DEVI: 1.0000 | 0 refills | Status: AC

## 2022-08-20 NOTE — Progress Notes (Signed)
New OB Intake  I connected with Heather Mccullough  on 08/20/22 at  1:10 PM EDT by telephone and verified that I am speaking with the correct person using two identifiers. Nurse is located at [facility] and pt is located at Home.  I discussed the limitations, risks, security and privacy concerns of performing an evaluation and management service by telephone and the availability of in person appointments. I also discussed with the patient that there may be a patient responsible charge related to this service. The patient expressed understanding and agreed to proceed.  I explained I am completing New OB Intake today. We discussed EDD of 02/02/23 that is based on LMP of 04/28/22. Pt is G1/P0. I reviewed her allergies, medications, Medical/Surgical/OB history, and appropriate screenings. I informed her of [facility] services. [facility] information placed in AVS. Based on history, this is a low risk pregnancy.  Patient Active Problem List   Diagnosis Date Noted   Alcohol abuse with intoxication 04/29/2022   Unspecified mood (affective) disorder 04/03/2015   Depression    Mood disorder     Concerns addressed today  Delivery Plans Plans to deliver at [facility]. Patient given information for [facility] Healthy Baby website for more information about [facility]. Patient is not interested in water birth. Offered upcoming OB visit with CNM to discuss further.  MyChart/Babyscripts MyChart access verified. I explained pt will have some visits in office and some virtually. Babyscripts instructions given and order placed. Patient verifies receipt of registration text/e-mail. Account successfully created and app downloaded.  Blood Pressure Cuff/Weight Scale Blood pressure cuff ordered for patient to pick-up from [employer]. Explained after first prenatal appt pt will check weekly and document in Babyscripts. Patient does have weight scale.  Anatomy US Explained first scheduled [place] will be around 19  weeks. Anatomy US ordered. Date TBD.   Labs Discussed Avelina Laine genetic screening with patient. Would like both Panorama and Horizon drawn at new OB visit. Routine prenatal labs needed.  COVID Vaccine Patient has had COVID vaccine.   Is patient a CenteringPregnancy candidate?  Declined Declined due to Group setting Not a candidate due to  If accepted,   Social Determinants of Health Food Insecurity: Patient denies food insecurity. WIC Referral: Patient is interested in referral to [facility].  Transportation: Patient denies transportation needs. Childcare: Discussed no children allowed at ultrasound appointments. Offered childcare services; patient declines childcare services at this time.  Interested in [place]? If yes, send referral and doula dot phrase.   First visit review I reviewed new OB appt with patient. I explained they will have a provider visit that includes prenatal labs, std screening, genetic screening, and discuss plan of care with provider. Explained pt will be seen by Randa Evens Autry-Lott at first visit; encounter routed to appropriate provider. Explained that patient will be seen by pregnancy navigator following visit with provider.   Hamilton Capri, RN 08/20/2022  1:29 PM

## 2022-08-27 ENCOUNTER — Ambulatory Visit (INDEPENDENT_AMBULATORY_CARE_PROVIDER_SITE_OTHER): Payer: Medicaid Other | Admitting: Licensed Clinical Social Worker

## 2022-08-27 ENCOUNTER — Other Ambulatory Visit (HOSPITAL_COMMUNITY)
Admission: RE | Admit: 2022-08-27 | Discharge: 2022-08-27 | Disposition: A | Discharge status: Home / Self Care | Payer: Medicaid Other | Source: Ambulatory Visit | Attending: Obstetrics & Gynecology | Admitting: Obstetrics & Gynecology

## 2022-08-27 ENCOUNTER — Ambulatory Visit (INDEPENDENT_AMBULATORY_CARE_PROVIDER_SITE_OTHER): Payer: Medicaid Other | Admitting: Family Medicine

## 2022-08-27 VITALS — BP 103/62 | HR 91 | Wt 143.2 lb

## 2022-08-27 DIAGNOSIS — Z3401 Encounter for supervision of normal first pregnancy, first trimester: Secondary | ICD-10-CM | POA: Diagnosis present

## 2022-08-27 DIAGNOSIS — F39 Unspecified mood [affective] disorder: Secondary | ICD-10-CM

## 2022-08-27 DIAGNOSIS — F10129 Alcohol abuse with intoxication, unspecified: Secondary | ICD-10-CM | POA: Diagnosis not present

## 2022-08-27 DIAGNOSIS — Z3A12 12 weeks gestation of pregnancy: Secondary | ICD-10-CM

## 2022-08-27 DIAGNOSIS — R87612 Low grade squamous intraepithelial lesion on cytologic smear of cervix (LGSIL): Secondary | ICD-10-CM

## 2022-08-27 DIAGNOSIS — F3289 Other specified depressive episodes: Secondary | ICD-10-CM | POA: Diagnosis not present

## 2022-08-27 NOTE — Progress Notes (Signed)
History:   Priyanka Samoria Fedorko is a 32 y.o. G1P0 at [redacted]w[redacted]d by {Ob dating:14516} being seen today for her first obstetrical visit.  Her obstetrical history is significant for {ob risk factors:10154}. Patient {does/does not:19097} intend to breast feed. Pregnancy history fully reviewed.  Patient reports {sx:14538}.      HISTORY: OB History  Gravida Para Term Preterm AB Living  1 0 0 0 0 0  SAB IAB Ectopic Multiple Live Births  0 0 0 0 0    # Outcome Date GA Lbr Len/2nd Weight Sex Delivery Anes PTL Lv  1 Current             Last pap smear was done *** and was {Normal/Abnormal Appearance:21344::"normal"}  Past Medical History:  Diagnosis Date   Depression    No past surgical history on file. No family history on file. Social History   Tobacco Use   Smoking status: Never   Smokeless tobacco: Never  Vaping Use   Vaping Use: Never used  Substance Use Topics   Alcohol use: Not Currently    Comment: not since confirmed pregnancy   Drug use: No   No Known Allergies Current Outpatient Medications on File Prior to Visit  Medication Sig Dispense Refill   acetaminophen (TYLENOL) 500 MG tablet Take 2 tablets (1,000 mg total) by mouth every 6 (six) hours as needed. 30 tablet 0   Blood Pressure Monitoring (BLOOD PRESSURE KIT) DEVI 1 kit by Does not apply route once a week. 1 each 0   DICLEGIS 10-10 MG TBEC Take 2 tablets by mouth at bedtime. If symptoms persist, add one tablet in the morning and one in the afternoon 100 tablet 5   Guaifenesin 1200 MG TB12 Take 1 tablet (1,200 mg total) by mouth in the morning and at bedtime. 14 tablet 0   Prenat-FeAsp-Meth-FA-DHA w/o A (PRENATE PIXIE) 10-0.6-0.4-200 MG CAPS Take 1 capsule by mouth daily. 30 capsule 11   Misc. Devices (GOJJI WEIGHT SCALE) MISC 1 Device by Does not apply route every 30 (thirty) days. (Patient not taking: Reported on 08/27/2022) 1 each 0   No current facility-administered medications on file prior to visit.    Review  of Systems Pertinent items noted in HPI and remainder of comprehensive ROS otherwise negative. Physical Exam:   Vitals:   08/27/22 0920  BP: 103/62  Pulse: 91  Weight: 143 lb 3.2 oz (65 kg)   Fetal Heart Rate (bpm): 159 ***Bedside Ultrasound for FHR check: Viable intrauterine pregnancy with positive cardiac activity note, fetal heart rate ***bpm Patient informed that the ultrasound is considered a limited obstetric ultrasound and is not intended to be a complete ultrasound exam.  Patient also informed that the ultrasound is not being completed with the intent of assessing for fetal or placental anomalies or any pelvic abnormalities.  Explained that the purpose of today's ultrasound is to assess for fetal heart rate.  Patient acknowledges the purpose of the exam and the limitations of the study.  Constitutional: Well-developed, well-nourished pregnant female in no acute distress.  HEENT: PERRLA Skin: normal color and turgor, no rash Cardiovascular: normal rate & rhythm, no murmur Respiratory: normal effort, lung sounds clear throughout GI: Abd soft, non-tender, pos BS x 4, gravid appropriate for gestational age MS: Extremities nontender, no edema, normal ROM Neurologic: Alert and oriented x 4.  GU: no CVA tenderness Pelvic: NEFG, physiologic discharge, no blood, cervix clean. ***Pap/swabs collected FHR:   Assessment:    Pregnancy: G1P0 Patient Active Problem  List   Diagnosis Date Noted   Encounter for supervision of normal first pregnancy in first trimester 08/20/2022   Alcohol abuse with intoxication 04/29/2022   Unspecified mood (affective) disorder 04/03/2015   Depression    Mood disorder      Plan:    There are no diagnoses linked to this encounter.  Initial labs drawn. Continue prenatal vitamins. Problem list reviewed and updated. Genetic Screening discussed, {Blank multiple:19196::"First trimester screen","Quad screen","NIPS"}:  {requests/ordered/declines:14581}. Ultrasound discussed; fetal anatomic survey: {requests/ordered/declines:14581}. Anticipatory guidance about prenatal visits given including labs, ultrasounds, and testing. Discussed usage of Babyscripts and virtual visits as additional source of managing and completing prenatal visits in midst of coronavirus and pandemic.   Encouraged to complete MyChart Registration for her ability to review results, send requests, and have questions addressed.  The nature of [place] [facility]/Faculty Practice with multiple MDs and Advanced Practice Providers was explained to patient; also emphasized that residents, students are part of our team. Routine obstetric precautions reviewed. Encouraged to seek out care at office or emergency room [facility] preferred) for urgent and/or emergent concerns. No follow-ups on file.     Edd Arbour, MSN, CNM, IBCLC Certified Nurse Midwife, [facility]

## 2022-08-27 NOTE — Progress Notes (Unsigned)
NOB in office Intake completed on 08/20/22 U/S completed at [facility] 06/20/22     Pt has no complaints today.

## 2022-08-28 LAB — CYTOLOGY - PAP
Comment: NEGATIVE
Comment: NEGATIVE
Comment: NEGATIVE
HPV 16: NEGATIVE
HPV 18 / 45: NEGATIVE
High risk HPV: POSITIVE — AB

## 2022-08-28 LAB — CERVICOVAGINAL ANCILLARY ONLY
Bacterial Vaginitis (gardnerella): NEGATIVE
Candida Glabrata: NEGATIVE
Candida Vaginitis: NEGATIVE
Chlamydia: NEGATIVE
Comment: NEGATIVE
Comment: NEGATIVE
Comment: NEGATIVE
Comment: NEGATIVE
Comment: NEGATIVE
Comment: NORMAL
Neisseria Gonorrhea: NEGATIVE
Trichomonas: NEGATIVE

## 2022-08-29 LAB — URINE CULTURE, OB REFLEX

## 2022-08-29 LAB — CULTURE, OB URINE

## 2022-08-29 NOTE — BH Specialist Note (Signed)
Integrated Behavioral Health Initial In-Person Visit  MRN: [id number] Name: Heather Mccullough  Number of Integrated Behavioral Health Clinician visits: 1 Session Start time:   9:45am Session End time: 10:33am Total time in minutes: 48 mins in person at [facility]   Types of Service: General Behavioral Integrated Care (BHI)  Interpretor:No. Interpretor Name and Language: none   Warm Hand Off Completed.        Subjective: Heather Mccullough is a 32 y.o. female accompanied by n/a Patient was referred by Dr Salvadore Dom for new ob intro. Patient reports the following symptoms/concerns: hx of mood disorder Duration of problem: approx 6 months; Severity of problem: mild  Objective: Mood: good and Affect: Appropriate Risk of harm to self or others: No plan to harm self or others  Life Context: Family and Social: Lives in [place]  School/Work: n/a Self-Care: n/a Life Changes: new pregnancy  Patient and/or Family's Strengths/Protective Factors: Concrete supports in place (healthy food, safe environments, etc.)  Goals Addressed: Patient will: History of mood disorder  Increase knowledge and/or ability of: coping skills  Demonstrate ability to: Increase healthy adjustment to current life circumstances  Progress towards Goals: Ongoing  Interventions: Interventions utilized: Supportive Counseling  Standardized Assessments completed: PHQ9  Patient and/or Family Response: Heather Mccullough reports history of depression.    Assessment: Patient report history of depression.   Patient may benefit from integrated behavioral health .  Plan: Follow up with behavioral health clinician on : next ob visit  Behavioral recommendations: Prioritize rest, communicate need for support with partner Referral(s): Integrated [employer] (In Clinic) "From scale of 1-10, how likely are you to follow plan?":    Gwyndolyn Saxon, LCSW

## 2022-09-02 LAB — PANORAMA PRENATAL TEST FULL PANEL:PANORAMA TEST PLUS 5 ADDITIONAL MICRODELETIONS: FETAL FRACTION: 7.4

## 2022-09-04 LAB — CBC/D/PLT+RPR+RH+ABO+RUBIGG...
Basophils Absolute: 0 10*3/uL (ref 0.0–0.2)
Basos: 0 %
EOS (ABSOLUTE): 0.1 10*3/uL (ref 0.0–0.4)
Eos: 1 %
HCV Ab: NONREACTIVE
HIV Screen 4th Generation wRfx: NONREACTIVE
Hematocrit: 35.5 % (ref 34.0–46.6)
Hemoglobin: 11.6 g/dL (ref 11.1–15.9)
Hepatitis B Surface Ag: NEGATIVE
Immature Grans (Abs): 0 10*3/uL (ref 0.0–0.1)
Immature Granulocytes: 0 %
Lymphocytes Absolute: 1 10*3/uL (ref 0.7–3.1)
Lymphs: 19 %
MCH: 25.8 pg — ABNORMAL LOW (ref 26.6–33.0)
MCHC: 32.7 g/dL (ref 31.5–35.7)
MCV: 79 fL (ref 79–97)
Monocytes Absolute: 0.3 10*3/uL (ref 0.1–0.9)
Monocytes: 7 %
Neutrophils Absolute: 3.6 10*3/uL (ref 1.4–7.0)
Neutrophils: 73 %
Platelets: 224 10*3/uL (ref 150–450)
RBC: 4.49 x10E6/uL (ref 3.77–5.28)
RDW: 14.5 % (ref 11.7–15.4)
RPR Ser Ql: NONREACTIVE
Rh Factor: POSITIVE
Rubella Antibodies, IGG: 2.65 index (ref >0.99)
WBC: 5 10*3/uL (ref 3.4–10.8)

## 2022-09-04 LAB — HORIZON CUSTOM: REPORT SUMMARY: NEGATIVE

## 2022-09-04 LAB — AB SCR+ANTIBODY ID: Antibody Screen: POSITIVE — AB

## 2022-09-04 LAB — HCV INTERPRETATION

## 2022-09-13 DIAGNOSIS — R87612 Low grade squamous intraepithelial lesion on cytologic smear of cervix (LGSIL): Secondary | ICD-10-CM | POA: Insufficient documentation

## 2022-09-13 NOTE — Progress Notes (Signed)
Please schedule colposcopy for LSIL pap

## 2022-09-15 ENCOUNTER — Telehealth: Payer: Self-pay

## 2022-09-15 NOTE — Telephone Encounter (Signed)
Patients Detail, Anatomy needs to be moved out due to EDD change  Left message for patient to call office back to reschedule

## 2022-09-16 ENCOUNTER — Ambulatory Visit: Payer: Medicaid Other

## 2022-09-24 ENCOUNTER — Ambulatory Visit (INDEPENDENT_AMBULATORY_CARE_PROVIDER_SITE_OTHER): Payer: Medicaid Other | Admitting: Advanced Practice Midwife

## 2022-09-24 VITALS — BP 107/62 | HR 90 | Wt 147.0 lb

## 2022-09-24 DIAGNOSIS — Z3401 Encounter for supervision of normal first pregnancy, first trimester: Secondary | ICD-10-CM

## 2022-09-24 DIAGNOSIS — F1011 Alcohol abuse, in remission: Secondary | ICD-10-CM | POA: Diagnosis not present

## 2022-09-24 DIAGNOSIS — F39 Unspecified mood [affective] disorder: Secondary | ICD-10-CM

## 2022-09-24 DIAGNOSIS — Z3A16 16 weeks gestation of pregnancy: Secondary | ICD-10-CM

## 2022-09-24 DIAGNOSIS — F3289 Other specified depressive episodes: Secondary | ICD-10-CM | POA: Diagnosis not present

## 2022-09-24 NOTE — Progress Notes (Signed)
Pt unsure if she is feeling fetal movement yet, denies pain.

## 2022-09-24 NOTE — Progress Notes (Signed)
   PRENATAL VISIT NOTE  Subjective:  Heather Mccullough is a 32 y.o. G1P0 at [redacted]w[redacted]d being seen today for ongoing prenatal care.  She is currently monitored for the following issues for this low-risk pregnancy and has Unspecified mood (affective) disorder (HCC); Depression; Mood disorder (HCC); Alcohol abuse with intoxication (HCC); Encounter for supervision of normal first pregnancy in first trimester; and LGSIL on Pap smear of cervix on their problem list.  Patient reports no complaints.  Contractions: Not present. Vag. Bleeding: None.   . Denies leaking of fluid.   The following portions of the patient's history were reviewed and updated as appropriate: allergies, current medications, past family history, past medical history, past social history, past surgical history and problem list.   Objective:   Vitals:   09/24/22 1000  BP: 107/62  Pulse: 90  Weight: 147 lb (66.7 kg)    Fetal Status: Fetal Heart Rate (bpm): 156         General:  Alert, oriented and cooperative. Patient is in no acute distress.  Skin: Skin is warm and dry. No rash noted.   Cardiovascular: Normal heart rate noted  Respiratory: Normal respiratory effort, no problems with respiration noted  Abdomen: Soft, gravid, appropriate for gestational age.  Pain/Pressure: Absent     Pelvic: Cervical exam deferred        Extremities: Normal range of motion.  Edema: None  Mental Status: Normal mood and affect. Normal behavior. Normal judgment and thought content.   Assessment and Plan:  Pregnancy: G1P0 at [redacted]w[redacted]d 1. Encounter for supervision of normal first pregnancy in first trimester --Anticipatory guidance about next visits/weeks of pregnancy given. --Pt is thinking about moving to [place] to live with her boyfriend.  She asked about finding an OB/Gyn and how to transfer records.  I recommended finding an Ob/Gyn before she moves or as as possible after. She can request her records before she goes, or have the new office  send [place] a request.  Pt is not sure yet and is just thinking about moving.    2. Unspecified mood (affective) disorder (HCC)   3. Other depression   4. History of alcohol abuse --Pt reports she is not drinking at all in the pregnancy. She has good support at her church and is motivated to stay sober.  --Pt saw IBH at previous visit, will schedule with next visit per pt    5. [redacted] weeks gestation of pregnancy   Preterm labor symptoms and general obstetric precautions including but not limited to vaginal bleeding, contractions, leaking of fluid and fetal movement were reviewed in detail with the patient. Please refer to After Visit Summary for other counseling recommendations.   Return in about 4 weeks (around 10/22/2022) for Any provider.  Future Appointments  Date Time Provider Department Center  10/21/2022  9:30 AM [facility] NURSE [facility]  10/21/2022  9:45 AM [facility] US5 [facility]  10/22/2022 10:35 AM Mercado-Ortiz, Lahoma Crocker, DO [facility] None  10/22/2022 11:00 AM Gwyndolyn Saxon, LCSW [facility] None    Sharen Counter, CNM

## 2022-10-21 ENCOUNTER — Ambulatory Visit: Payer: Medicaid Other | Admitting: *Deleted

## 2022-10-21 ENCOUNTER — Encounter: Payer: Self-pay | Admitting: *Deleted

## 2022-10-21 ENCOUNTER — Other Ambulatory Visit: Payer: Self-pay | Admitting: *Deleted

## 2022-10-21 ENCOUNTER — Ambulatory Visit: Payer: Medicaid Other | Attending: Obstetrics & Gynecology

## 2022-10-21 VITALS — BP 103/55 | HR 78

## 2022-10-21 DIAGNOSIS — Z363 Encounter for antenatal screening for malformations: Secondary | ICD-10-CM | POA: Diagnosis not present

## 2022-10-21 DIAGNOSIS — Z3401 Encounter for supervision of normal first pregnancy, first trimester: Secondary | ICD-10-CM

## 2022-10-21 DIAGNOSIS — O99312 Alcohol use complicating pregnancy, second trimester: Secondary | ICD-10-CM

## 2022-10-21 DIAGNOSIS — F1099 Alcohol use, unspecified with unspecified alcohol-induced disorder: Secondary | ICD-10-CM

## 2022-10-21 DIAGNOSIS — Z3A20 20 weeks gestation of pregnancy: Secondary | ICD-10-CM

## 2022-10-21 DIAGNOSIS — F1010 Alcohol abuse, uncomplicated: Secondary | ICD-10-CM | POA: Insufficient documentation

## 2022-10-21 DIAGNOSIS — Z3492 Encounter for supervision of normal pregnancy, unspecified, second trimester: Secondary | ICD-10-CM

## 2022-10-22 ENCOUNTER — Ambulatory Visit (INDEPENDENT_AMBULATORY_CARE_PROVIDER_SITE_OTHER): Payer: Medicaid Other | Admitting: Licensed Clinical Social Worker

## 2022-10-22 ENCOUNTER — Ambulatory Visit (INDEPENDENT_AMBULATORY_CARE_PROVIDER_SITE_OTHER): Payer: Medicaid Other | Admitting: Family Medicine

## 2022-10-22 VITALS — BP 104/60 | HR 88 | Wt 152.4 lb

## 2022-10-22 DIAGNOSIS — Z3402 Encounter for supervision of normal first pregnancy, second trimester: Secondary | ICD-10-CM

## 2022-10-22 DIAGNOSIS — Z3A20 20 weeks gestation of pregnancy: Secondary | ICD-10-CM | POA: Diagnosis not present

## 2022-10-22 DIAGNOSIS — F39 Unspecified mood [affective] disorder: Secondary | ICD-10-CM

## 2022-10-22 NOTE — Progress Notes (Signed)
Pt reports feeling fetal flutter movements, denies pain. Pt states that she will be moving to [place] this weekend so she will need records transferred.

## 2022-10-22 NOTE — Progress Notes (Signed)
PRENATAL VISIT NOTE  Subjective:  Heather Mccullough is a 32 y.o. G1P0 at [redacted]w[redacted]d being seen today for ongoing prenatal care.  She is currently monitored for the following issues for this  normal  pregnancy and has Unspecified mood (affective) disorder (HCC); Depression; Mood disorder (HCC); Alcohol abuse with intoxication (HCC); Encounter for prenatal care of first pregnancy, second trimester; and LGSIL on Pap smear of cervix on their problem list.  Patient reports no complaints. Mood is appropriate, well-controlled. She is eating well and has no questions at this time regarding her pregnancy.  She is moving to [place] on 6/15.  Her first appt with her new OB in [place] is on July 24.  She has been taking her prenatal vitamins and has enough until her next appointment.  She is requesting records release for her new provider in [place].  Contractions: Not present. Vag. Bleeding: None.  Movement: Present. Denies leaking of fluid.   The following portions of the patient's history were reviewed and updated as appropriate: allergies, current medications, past family history, past medical history, past social history, past surgical history and problem list.   Objective:   Vitals:   10/22/22 1043  BP: 104/60  Pulse: 88  Weight: 152 lb 6.4 oz (69.1 kg)    Fetal Status: Fetal Heart Rate (bpm): 148   Movement: Present     General:  Alert, oriented and cooperative. Patient is in no acute distress.  Skin: Skin is warm and dry. No rash noted.   Cardiovascular: Normal heart rate noted  Respiratory: Normal respiratory effort, no problems with respiration noted  Abdomen: Soft, gravid, appropriate for gestational age.  Pain/Pressure: Absent     Pelvic: Cervical exam deferred        Extremities: Normal range of motion.  Edema: None  Mental Status: Normal mood and affect. Normal behavior. Normal judgment and thought content.   Assessment and Plan:  Pregnancy: G1P0 at [redacted]w[redacted]d  1. Encounter for prenatal  care of first pregnancy, second trimester  2. Mood disorder (HCC)   Mood appropriate.  Patient has good support and has no further questions.  Congratulated her on her pregnancy and encouraged her to use MyChart to reach out if she has any questions until she meets her new provider. NURSING  PROVIDER  Office Location [facility] Dating by [facility] 04/28/22  [facility] Model Traditional Anatomy U/S   Initiated care at  16 wks                Language  English              LAB RESULTS   Support Person FOB and Mom Genetics NIPS:    LR         AFP:                NT/IT (FT only)     Carrier Screen Horizon: NEG  Rhogam  A/Positive/-- (04/17 1034) A1C/GTT Early:             Third trimester:  Flu Vaccine Declined 08/20/22    TDaP Vaccine   Blood Type A/Positive/-- (04/17 1034)  Covid Vaccine Vaccinated Antibody Positive, See Final Results (04/17 1034)    Rubella 2.65 (04/17 1034)  Feeding Plan Breast RPR Non Reactive (04/17 1034)  Contraception Declined HBsAg Negative (04/17 1034)  Circumcision Yes if a boy HIV Non Reactive (04/17 1034)  Pediatrician  Undecided HCVAb Non Reactive (04/17 1034)  Prenatal Classes       Pap Diagnosis  Date Value Ref Range Status  08/27/2022 - Low grade squamous intraepithelial lesion (LSIL) (A)  Final    BTLConsent  GC/CT Initial:             36wks:  VBAC  Consent  GBS   For PCN allergy, check sensitivities        DME Rx Arly.Keller ] BP cuff Arly.Keller ] Weight Scale Waterbirth  [ ]  Class [ ]  Consent [ ]  CNM visit  PHQ9 & GAD7 [ X ] new OB [  ] 28 weeks  [  ] 36 weeks Induction  [ ]  Orders Entered [ ] Foley Y/N     Preterm labor symptoms and general obstetric precautions including but not limited to vaginal bleeding, contractions, leaking of fluid and fetal movement were reviewed in detail with the patient. Please refer to After Visit Summary for other counseling recommendations.    Future Appointments  Date Time Provider Department Center  12/03/2022 10:45 AM [facility] NURSE [facility]   12/03/2022 11:00 AM [facility] US1 [facility]    Myrtie Hawk, DO FMOB Fellow, Faculty practice [facility], Center for [facility] 10/22/22  11:06 AM

## 2022-10-27 NOTE — BH Specialist Note (Cosign Needed)
Integrated Behavioral Health Follow Up In-Person Visit  MRN: [id number] Name: Heather Mccullough  Number of Integrated Behavioral Health Clinician visits: 2 Session Start time:  11:00am Session End time: 11:30am Total time in minutes: 30 mins in person at [facility]   Types of Service: Individual psychotherapy  Interpretor:No. Interpretor Name and Language: none  Subjective: Heather Mccullough is a 32 y.o. female accompanied by  supportive friend  Patient was referred by Dr Roma Schanz- [facility] Salines. Patient reports the following symptoms/concerns: anxious mood, reports feeling overwhelmed due to pending eviction and move to [place]  Duration of problem: approx one month; Severity of problem:  mild Objective: Mood: Anxious and Affect: Appropriate Risk of harm to self or others: No plan to harm self or others  Life Context: Family and Social: Lives in [place] lives in [place]  School/Work: unemp Self-Care: n/a Life Changes: new pregnancy  Patient and/or Family's Strengths/Protective Factors: Concrete supports in place (healthy food, safe environments, etc.)  Goals Addressed: Patient will:  Reduce symptoms of: mood instability   Increase knowledge and/or ability of: coping skills   Demonstrate ability to: Increase healthy adjustment to current life circumstances  Progress towards Goals: Ongoing  Interventions: Interventions utilized:  Supportive Counseling Standardized Assessments completed: Not Needed  Patient and/or Family Response:    Assessment: Patient currently experiencing Heather Mccullough reports moving to [place] with father of baby. Heather Mccullough reports she is not working and facing eviction.    Patient may benefit from integrated behavioral health.  Plan: Follow up with behavioral health clinician on : as needed Behavioral recommendations: notice office with new ob information to transfer medical records  Referral(s): Integrated [employer] (In  Clinic) "From scale of 1-10, how likely are you to follow plan?":    Gwyndolyn Saxon, LCSW

## 2022-11-27 ENCOUNTER — Telehealth: Payer: Self-pay

## 2022-11-27 NOTE — Telephone Encounter (Signed)
Pt left vm stating her medical records had not been received by the obgyn office she is transferring her care to.  Attempted to contact pt, no answer. Left vm asking pt to call the office back.

## 2022-12-03 ENCOUNTER — Ambulatory Visit: Payer: Medicaid Other
